# Patient Record
Sex: Male | Born: 1985 | Race: Black or African American | Hispanic: No | Marital: Single | Smoking: Never smoker
Health system: Southern US, Community
[De-identification: ages and names within clinical notes are randomized; demographics above are authoritative.]

---

## 2015-03-16 ENCOUNTER — Encounter (HOSPITAL_COMMUNITY): Payer: Self-pay | Admitting: Family Medicine

## 2015-03-16 ENCOUNTER — Emergency Department (HOSPITAL_COMMUNITY)
Admission: EM | Admit: 2015-03-16 | Discharge: 2015-03-16 | Disposition: A | Discharge status: Home / Self Care | Payer: Self-pay | Attending: Emergency Medicine | Admitting: Emergency Medicine

## 2015-03-16 DIAGNOSIS — R112 Nausea with vomiting, unspecified: Secondary | ICD-10-CM | POA: Insufficient documentation

## 2015-03-16 DIAGNOSIS — Z720 Tobacco use: Secondary | ICD-10-CM | POA: Insufficient documentation

## 2015-03-16 DIAGNOSIS — R61 Generalized hyperhidrosis: Secondary | ICD-10-CM | POA: Insufficient documentation

## 2015-03-16 DIAGNOSIS — R197 Diarrhea, unspecified: Secondary | ICD-10-CM | POA: Insufficient documentation

## 2015-03-16 DIAGNOSIS — R1110 Vomiting, unspecified: Secondary | ICD-10-CM

## 2015-03-16 MED ORDER — ONDANSETRON 4 MG PO TBDP: 4.0000 mg | ORAL_TABLET | Freq: Three times a day (TID) | ORAL | Status: DC | PRN

## 2015-03-16 MED ORDER — ONDANSETRON 4 MG PO TBDP
4.0000 mg | ORAL_TABLET | Freq: Once | ORAL | Status: AC
  Administered 2015-03-16: 4 mg via ORAL
  Filled 2015-03-16: qty 1

## 2015-03-16 NOTE — Discharge Instructions (Signed)

## 2015-03-16 NOTE — ED Provider Notes (Signed)
CSN: [id number]     Arrival date & time 03/16/15  0747 History   First MD Initiated Contact with Patient 03/16/15 [phone]     Chief Complaint  Patient presents with  . Diarrhea  . Emesis     (Consider location/radiation/quality/duration/timing/severity/associated sxs/prior Treatment) Patient is a 29 y.o. male presenting with general illness. The history is provided by the patient.  Illness Severity:  Mild Onset quality:  Sudden Duration:  1 day Timing:  Constant Progression:  Partially resolved Chronicity:  New Associated symptoms: nausea and vomiting   Associated symptoms: no abdominal pain, no chest pain, no congestion, no diarrhea, no fever, no headaches, no myalgias, no rash and no shortness of breath    29 year old male in no significant past medical history chief complaint of vomiting and diarrhea. Patient states that this started yesterday after eating some shrimp Alfredo. Patient tried to induce vomiting secondary to nausea and had multiple bouts of emesis followed by a couple loose stools. Patient denies abdominal pain denies fevers chills. Patient feeling much better today able to eat and drink this morning.  History reviewed. No pertinent past medical history. History reviewed. No pertinent past surgical history. History reviewed. No pertinent family history. History  Substance Use Topics  . Smoking status: Current Every Day Smoker  . Smokeless tobacco: Not on file  . Alcohol Use: Yes    Review of Systems  Constitutional: Positive for diaphoresis (prior to vomiting). Negative for fever and chills.  HENT: Negative for congestion and facial swelling.   Eyes: Negative for discharge and visual disturbance.  Respiratory: Negative for shortness of breath.   Cardiovascular: Negative for chest pain and palpitations.  Gastrointestinal: Positive for nausea and vomiting. Negative for abdominal pain and diarrhea.  Musculoskeletal: Negative for myalgias and arthralgias.  Skin:  Negative for color change and rash.  Neurological: Negative for tremors, syncope and headaches.  Psychiatric/Behavioral: Negative for confusion and dysphoric mood.      Allergies  Review of patient's allergies indicates no known allergies.  Home Medications   Prior to Admission medications   Not on File   BP 148/98 mmHg  Pulse 83  Temp(Src) 98.1 F (36.7 C)  Resp 18  Ht 5\' 9"  (1.753 m)  Wt 238 lb (107.956 kg)  BMI 35.13 kg/m2  SpO2 98% Physical Exam  Constitutional: He is oriented to person, place, and time. He appears well-developed and well-nourished.  HENT:  Head: Normocephalic and atraumatic.  Eyes: EOM are normal. Pupils are equal, round, and reactive to light.  Neck: Normal range of motion. Neck supple. No JVD present.  Cardiovascular: Normal rate and regular rhythm.  Exam reveals no gallop and no friction rub.   No murmur heard. Pulmonary/Chest: No respiratory distress. He has no wheezes.  Abdominal: He exhibits no distension. There is no tenderness. There is no rebound and no guarding.  Benign abdominal exam negative Murphy sign  Musculoskeletal: Normal range of motion.  Neurological: He is alert and oriented to person, place, and time.  Skin: No rash noted. No pallor.  Psychiatric: He has a normal mood and affect. His behavior is normal.    ED Course  Procedures (including critical care time) Labs Review Labs Reviewed - No data to display  Imaging Review No results found.   EKG Interpretation None      MDM   Final diagnoses:  None    29 yo male with a chief complaint of nausea vomiting diarrhea. Likely viral in nature. No noted abdominal pain. Afebrile.  No prior medical issues vital signs stable see no need for laboratory evaluation at this time. Will have follow-up with his PCP. Return precautions given. Patient mildly hypertensive on the ED. Will follow up with PCP in 1 week for recheck.  9:19 AM:  I have discussed the diagnosis/risks/treatment  options with the patient and believe the pt to be eligible for discharge home to follow-up with PCP. We also discussed returning to the ED immediately if new or worsening sx occur. We discussed the sx which are most concerning (e.g., abdominal pain, fever) that necessitate immediate return. Medications administered to the patient during their visit and any new prescriptions provided to the patient are listed below.    Medications given during this visit Medications  ondansetron (ZOFRAN-ODT) disintegrating tablet 4 mg (not administered)    New Prescriptions   ONDANSETRON (ZOFRAN ODT) 4 MG DISINTEGRATING TABLET    Take 1 tablet (4 mg total) by mouth every 8 (eight) hours as needed for nausea or vomiting.       Melene Plan, DO 03/16/15 [phone]

## 2015-03-16 NOTE — ED Notes (Signed)
Pt here for V,D that started last night about 9 pm after eating some shrimp alfredo. sts vomited a few x and diarrhea. sts this am feels better and held down some crackers and water.

## 2015-03-16 NOTE — ED Notes (Signed)
Pt placed in gown and on monitor. Call bell at bedside

## 2016-10-26 ENCOUNTER — Ambulatory Visit (HOSPITAL_COMMUNITY)
Admission: EM | Admit: 2016-10-26 | Discharge: 2016-10-26 | Disposition: A | Discharge status: Home / Self Care | Payer: Self-pay | Attending: Emergency Medicine | Admitting: Emergency Medicine

## 2016-10-26 ENCOUNTER — Encounter (HOSPITAL_COMMUNITY): Payer: Self-pay | Admitting: Emergency Medicine

## 2016-10-26 DIAGNOSIS — K047 Periapical abscess without sinus: Secondary | ICD-10-CM

## 2016-10-26 MED ORDER — HYDROCODONE-ACETAMINOPHEN 5-325 MG PO TABS: 1.0000 | ORAL_TABLET | ORAL | 0 refills | Status: DC | PRN

## 2016-10-26 MED ORDER — CLINDAMYCIN HCL 150 MG PO CAPS: 150.0000 mg | ORAL_CAPSULE | Freq: Four times a day (QID) | ORAL | 0 refills | Status: DC

## 2016-10-26 NOTE — ED Provider Notes (Signed)
CSN: [id number]     Arrival date & time 10/26/16  1926 History   First MD Initiated Contact with Patient 10/26/16 2038     Chief Complaint  Patient presents with  . Dental Pain   (Consider location/radiation/quality/duration/timing/severity/associated sxs/prior Treatment) 31 year old male presents with a 2-3 day history of dental pain. He does not have a dentist, and he has not had dental care in some time. He denies any fever, or nausea, however he describes his pain as intense, and throbbing. It is worse with chewing, and with foods of different temperature such as hot or cold.   The history is provided by the patient.  Dental Pain    History reviewed. No pertinent past medical history. History reviewed. No pertinent surgical history. History reviewed. No pertinent family history. Social History  Substance Use Topics  . Smoking status: Current Every Day Smoker  . Smokeless tobacco: Never Used  . Alcohol use Yes    Review of Systems  Reason unable to perform ROS: As covered in history of present illness.  All other systems reviewed and are negative.   Allergies  Patient has no known allergies.  Home Medications   Prior to Admission medications   Medication Sig Start Date End Date Taking? Authorizing Provider  clindamycin (CLEOCIN) 150 MG capsule Take 1 capsule (150 mg total) by mouth every 6 (six) hours. 10/26/16   Dorena BodoLawrence Rafeal Skibicki, NP  HYDROcodone-acetaminophen (NORCO/VICODIN) 5-325 MG tablet Take 1-2 tablets by mouth every 4 (four) hours as needed for moderate pain. 10/26/16   Dorena BodoLawrence Cosette Prindle, NP  ondansetron (ZOFRAN ODT) 4 MG disintegrating tablet Take 1 tablet (4 mg total) by mouth every 8 (eight) hours as needed for nausea or vomiting. 03/16/15   Melene Planan Floyd, DO   Meds Ordered and Administered this Visit  Medications - No data to display  BP 160/98 (BP Location: Right Arm)   Pulse 82   Temp 98.9 F (37.2 C) (Oral)   Resp 14   SpO2 99%  No data found.   Physical  Exam  Constitutional: He is oriented to person, place, and time. He appears well-developed and well-nourished. No distress.  HENT:  Head: Normocephalic and atraumatic.  Right Ear: External ear normal.  Left Ear: External ear normal.  Mouth/Throat: Dental abscesses and dental caries present.  Infected dental caries and abscess at the 15th tooth.  Neck: Normal range of motion.  Cardiovascular: Normal rate and regular rhythm.   Pulmonary/Chest: Effort normal and breath sounds normal.  Lymphadenopathy:    He has no cervical adenopathy.  Neurological: He is alert and oriented to person, place, and time.  Skin: Skin is warm and dry. Capillary refill takes less than 2 seconds. He is not diaphoretic.  Psychiatric: He has a normal mood and affect. His behavior is normal.  Nursing note and vitals reviewed.   Urgent Care Course     Procedures (including critical care time)  Labs Review Labs Reviewed - No data to display  Imaging Review No results found.     MDM   1. Dental infection     You have infected dental abscess. I prescribed 2 medicines for it, the first is an antibiotic called clindamycin. Take this medication as directed. I have also prescribed a medicine for pain called hydrocodone, this medicine is a narcotic, it will cause drowsiness, and it is addictive. Do not take more than what is necessary, do not drink alcohol while taking, and do not operate any heavy machinery while taking this medicine.  I also recommend taking over-the-counter ibuprofen, every 6 hours around-the-clock. This will help reduce your pain. You will need to follow-up with a dentist for definitive care, otherwise your pain will return.  [place] controlled substances reporting system consulted prior to issuing prescription.     Dorena Bodo, NP 10/26/16 2156

## 2016-10-26 NOTE — ED Triage Notes (Signed)
Here for left upper dental pain onset last night associated w/facial swelling  Denies fevers  A&O x4... NAD

## 2016-10-26 NOTE — Discharge Instructions (Addendum)
You have infected dental abscess. I prescribed 2 medicines for it, the first is an antibiotic called clindamycin. Take this medication as directed. I have also prescribed a medicine for pain called hydrocodone, this medicine is a narcotic, it will cause drowsiness, and it is addictive. Do not take more than what is necessary, do not drink alcohol while taking, and do not operate any heavy machinery while taking this medicine. I also recommend taking over-the-counter ibuprofen, every 6 hours around-the-clock. This will help reduce your pain. You will need to follow-up with a dentist for definitive care, otherwise your pain will return.

## 2017-12-02 ENCOUNTER — Other Ambulatory Visit: Payer: Self-pay

## 2017-12-02 ENCOUNTER — Encounter (HOSPITAL_COMMUNITY): Payer: Self-pay | Admitting: Emergency Medicine

## 2017-12-02 ENCOUNTER — Ambulatory Visit (HOSPITAL_COMMUNITY)
Admission: EM | Admit: 2017-12-02 | Discharge: 2017-12-02 | Disposition: A | Discharge status: Home / Self Care | Payer: Self-pay | Attending: Internal Medicine | Admitting: Internal Medicine

## 2017-12-02 DIAGNOSIS — L03317 Cellulitis of buttock: Secondary | ICD-10-CM

## 2017-12-02 DIAGNOSIS — R030 Elevated blood-pressure reading, without diagnosis of hypertension: Secondary | ICD-10-CM

## 2017-12-02 DIAGNOSIS — L0231 Cutaneous abscess of buttock: Secondary | ICD-10-CM

## 2017-12-02 MED ORDER — TRAMADOL HCL 50 MG PO TABS: 50.0000 mg | ORAL_TABLET | Freq: Four times a day (QID) | ORAL | 0 refills | Status: DC | PRN

## 2017-12-02 MED ORDER — SULFAMETHOXAZOLE-TRIMETHOPRIM 800-160 MG PO TABS: 1.0000 | ORAL_TABLET | Freq: Two times a day (BID) | ORAL | 0 refills | Status: DC

## 2017-12-02 NOTE — ED Triage Notes (Signed)
Patient complains of abscess.  History of the same.  This abscess present for 6 days.

## 2017-12-02 NOTE — ED Provider Notes (Signed)
[facility]    CSN: [id number] Arrival date & time: 12/02/17  1005     History   Chief Complaint Chief Complaint  Patient presents with  . Abscess    HPI Robert Marquez is a 32 y.o. male.   32 year old male presents with abscess to left buttock area. Started with a "bump" on the inside of his left buttock about 6 days ago- continues to get larger and more painful. Has been trying to apply warm compresses to area with minimal relief. No drainage. Has also taken OTC pain medication with minimal relief in pain- having difficulty sleeping due to location of abscess and pain level. Has history of previous abscess many months ago on right upper buttock but drained and resolved on own without treatment. No known injury to area. No fever, vomiting, diarrhea or constipation. No other chronic health issues- patient indicates that blood pressure and pulse is elevated today due to pain. Takes no daily medication.   The history is provided by the patient.    History reviewed. No pertinent past medical history.  There are no active problems to display for this patient.   History reviewed. No pertinent surgical history.     Home Medications    Prior to Admission medications   Medication Sig Start Date End Date Taking? Authorizing Provider  sulfamethoxazole-trimethoprim (BACTRIM DS,SEPTRA DS) 800-160 MG tablet Take 1 tablet by mouth 2 (two) times daily for 10 days. 12/02/17 12/12/17  Sudie Grumbling, NP  traMADol (ULTRAM) 50 MG tablet Take 1 tablet (50 mg total) by mouth every 6 (six) hours as needed for severe pain. 12/02/17   Sudie Grumbling, NP    Family History Family History  Problem Relation Age of Onset  . Healthy Mother     Social History Social History   Tobacco Use  . Smoking status: Current Every Day Smoker  . Smokeless tobacco: Never Used  Substance Use Topics  . Alcohol use: Yes  . Drug use: No     Allergies   Patient has no known  allergies.   Review of Systems Review of Systems  Constitutional: Negative for activity change, appetite change, chills, fatigue and fever.  Respiratory: Negative for cough, chest tightness, shortness of breath and wheezing.   Gastrointestinal: Positive for rectal pain. Negative for abdominal pain, anal bleeding, blood in stool, constipation, diarrhea, nausea and vomiting.  Genitourinary: Negative for difficulty urinating, dysuria and hematuria.  Musculoskeletal: Negative for arthralgias, back pain and myalgias.  Skin: Positive for color change and wound (abscess but not open). Negative for rash.  Allergic/Immunologic: Negative for immunocompromised state.  Neurological: Negative for dizziness, seizures, syncope, weakness, light-headedness, numbness and headaches.  Hematological: Negative for adenopathy. Does not bruise/bleed easily.  Psychiatric/Behavioral: Negative.      Physical Exam Triage Vital Signs ED Triage Vitals  Enc Vitals Group     BP 12/02/17 1027 (!) 138/100     Pulse Rate 12/02/17 1027 (!) 119     Resp 12/02/17 1027 (!) 22     Temp 12/02/17 1027 98.2 F (36.8 C)     Temp Source 12/02/17 1027 Oral     SpO2 12/02/17 1027 96 %     Weight --      Height --      Head Circumference --      Peak Flow --      Pain Score 12/02/17 1025 9     Pain Loc --      Pain Edu? --  Excl. in GC? --    No data found.  Updated Vital Signs BP (!) 138/100 (BP Location: Left Arm) Comment (BP Location): large cuff Simultaneous filing. User may not have seen previous data.  Pulse (!) 119 Comment: Simultaneous filing. User may not have seen previous data.  Temp 98.2 F (36.8 C) (Oral) Comment: Simultaneous filing. User may not have seen previous data. Comment (Src): Simultaneous filing. User may not have seen previous data.  Resp (!) 22 Comment: Simultaneous filing. User may not have seen previous data.  SpO2 96% Comment: Simultaneous filing. User may not have seen previous  data.  Visual Acuity Right Eye Distance:   Left Eye Distance:   Bilateral Distance:    Right Eye Near:   Left Eye Near:    Bilateral Near:     Physical Exam  Constitutional: He is oriented to person, place, and time. He appears well-developed and well-nourished. He is cooperative. He does not appear ill. No distress.  Patient nervous and appears in pain but in no acute distress.   HENT:  Head: Normocephalic and atraumatic.  Right Ear: External ear normal.  Left Ear: External ear normal.  Eyes: Conjunctivae and EOM are normal.  Neck: Normal range of motion.  Cardiovascular: Regular rhythm. Tachycardia present.  Pulmonary/Chest: Effort normal and breath sounds normal. No respiratory distress.  Musculoskeletal: Normal range of motion.  Neurological: He is alert and oriented to person, place, and time. He has normal strength. No sensory deficit.  Skin: Skin is warm, dry and intact. Capillary refill takes less than 2 seconds. Lesion noted. No bruising, no ecchymosis and no rash noted. There is erythema.     About 4cm by 2cm firm dark red abscess present on left inner buttock near pilonidal tract but not part of the tract. Warm and Very tender. No drainage or central punctated area.  Surrounding erythema of about 1cm beyond edge of abscess.   Psychiatric: His speech is normal and behavior is normal. Judgment and thought content normal. His mood appears anxious. Cognition and memory are normal.  Vitals reviewed.    UC Treatments / Results  Labs (all labs ordered are listed, but only abnormal results are displayed) Labs Reviewed - No data to display  EKG None Radiology No results found.  Procedures Incision and Drainage Date/Time: 12/02/2017 11:30 AM Performed by: Sudie Grumbling, NP Authorized by: Isa Rankin, MD   Consent:    Consent obtained:  Verbal   Consent given by:  Patient   Risks discussed:  Bleeding, incomplete drainage and pain   Alternatives  discussed:  Alternative treatment, referral and no treatment Location:    Type:  Abscess   Size:  4cm by 2cm   Location:  Anogenital   Anogenital location:  Gluteal cleft Pre-procedure details:    Skin preparation:  Betadine Anesthesia (see MAR for exact dosages):    Anesthesia method:  Topical application   Topical anesthesia: anesthetic spray. Procedure type:    Complexity:  Simple Procedure details:    Needle aspiration: no     Incision types:  Single straight   Incision depth:  Dermal   Scalpel blade:  11   Wound management:  Debrided   Drainage:  Bloody and serosanguinous   Drainage amount:  Moderate   Wound treatment:  Wound left open   Packing materials:  None Post-procedure details:    Patient tolerance of procedure:  Tolerated well, no immediate complications Comments:     Patient tolerated procedure well. Mostly  thin yellowish to bloody discharge expressed. Unable to drain completely. Covered with non-stick gauze.    (including critical care time)  Medications Ordered in UC Medications - No data to display   Initial Impression / Assessment and Plan / UC Course  I have reviewed the triage vital signs and the nursing notes.  Pertinent labs & imaging results that were available during my care of the patient were reviewed by me and considered in my medical decision making (see chart for details).    Discussed possibility of incomplete drainage due to depth of abscess. Patient understood and requested to proceed with I & D today. No distinct pus pocket detected. Slow drainage of fluid present but abscess did decrease considerably in size. Recommend start Bactrim DS 1 tablet twice a day as directed for 10 days. May take OTC Ibuprofen 600mg  every 6 hours as needed for pain. May also take Tramadol 50mg  every 6 hours as needed for severe pain. Continue to soak in warm water 3 to 4 times a day as needed to help with drainage. Recommend call Surgeon (information provided) for  further evaluation if abscess does not improve or start resolving within 4 to 5 days.    Final Clinical Impressions(s) / UC Diagnoses   Final diagnoses:  Abscess of buttock, left  Cellulitis of buttock  Elevated blood-pressure reading without diagnosis of hypertension    ED Discharge Orders        Ordered    sulfamethoxazole-trimethoprim (BACTRIM DS,SEPTRA DS) 800-160 MG tablet  2 times daily     12/02/17 1124    traMADol (ULTRAM) 50 MG tablet  Every 6 hours PRN     12/02/17 1126       Controlled Substance Prescriptions [place] Controlled Substance Registry consulted? Yes, I have consulted the [place] Controlled Substances Registry for this patient, and feel the risk/benefit ratio today is favorable for proceeding with this prescription for a controlled substance. Last active Rx was March 2018 for Hydrocodone-Acetaminophen.  No active Rx in the past 6 months.    Sudie GrumblingAmyot, Ann Berry, NP 12/02/17 2013

## 2017-12-02 NOTE — Discharge Instructions (Addendum)
Recommend start Bactrim DS 1 tablet twice a day as directed for 10 days. May take Ibuprofen 600mg  every 6 hours as needed for pain. May also take Tramadol 50mg  every 6 hours as needed for severe pain. Continue to soak in warm water 3 to 4 times a day as needed to help with drainage. Recommend call Surgeon for further evaluation if abscess does not improve or start resolving within 4 to 5 days.

## 2017-12-05 ENCOUNTER — Inpatient Hospital Stay (HOSPITAL_COMMUNITY): Payer: Self-pay

## 2017-12-05 ENCOUNTER — Inpatient Hospital Stay (HOSPITAL_COMMUNITY)
Admission: EM | Admit: 2017-12-05 | Discharge: 2017-12-07 | DRG: 854 | Disposition: A | Discharge status: Home / Self Care | Payer: Self-pay | Attending: Family Medicine | Admitting: Family Medicine

## 2017-12-05 ENCOUNTER — Encounter (HOSPITAL_COMMUNITY): Payer: Self-pay | Admitting: Emergency Medicine

## 2017-12-05 DIAGNOSIS — Z79899 Other long term (current) drug therapy: Secondary | ICD-10-CM

## 2017-12-05 DIAGNOSIS — A419 Sepsis, unspecified organism: Principal | ICD-10-CM | POA: Diagnosis present

## 2017-12-05 DIAGNOSIS — L03317 Cellulitis of buttock: Secondary | ICD-10-CM | POA: Diagnosis present

## 2017-12-05 DIAGNOSIS — L0231 Cutaneous abscess of buttock: Secondary | ICD-10-CM

## 2017-12-05 LAB — BASIC METABOLIC PANEL
ANION GAP: 13 (ref 5–15)
BUN: 9 mg/dL (ref 6–20)
CO2: 26 mmol/L (ref 22–32)
CREATININE: 0.96 mg/dL (ref 0.61–1.24)
Calcium: 10.3 mg/dL (ref 8.9–10.3)
Chloride: 98 mmol/L — ABNORMAL LOW (ref 101–111)
GFR calc Af Amer: >60 mL/min (ref >60)
Glucose, Bld: 121 mg/dL — ABNORMAL HIGH (ref 65–99)
Potassium: 3.6 mmol/L (ref 3.5–5.1)
SODIUM: 137 mmol/L (ref 135–145)

## 2017-12-05 LAB — CBC WITH DIFFERENTIAL/PLATELET
Basophils Absolute: 0 10*3/uL (ref 0.0–0.1)
Basophils Relative: 0 %
EOS ABS: 0.2 10*3/uL (ref 0.0–0.7)
Eosinophils Relative: 1 %
HCT: 41.2 % (ref 39.0–52.0)
Hemoglobin: 14.3 g/dL (ref 13.0–17.0)
LYMPHS PCT: 16 %
Lymphs Abs: 3.4 10*3/uL (ref 0.7–4.0)
MCH: 29.4 pg (ref 26.0–34.0)
MCHC: 34.7 g/dL (ref 30.0–36.0)
MCV: 84.6 fL (ref 78.0–100.0)
Monocytes Absolute: 2.8 10*3/uL — ABNORMAL HIGH (ref 0.1–1.0)
Monocytes Relative: 13 %
Neutro Abs: 15 10*3/uL — ABNORMAL HIGH (ref 1.7–7.7)
Neutrophils Relative %: 70 %
PLATELETS: 452 10*3/uL — AB (ref 150–400)
RBC: 4.87 MIL/uL (ref 4.22–5.81)
RDW: 13 % (ref 11.5–15.5)
WBC: 21.4 10*3/uL — ABNORMAL HIGH (ref 4.0–10.5)

## 2017-12-05 LAB — I-STAT CG4 LACTIC ACID, ED: Lactic Acid, Venous: 1.82 mmol/L (ref 0.5–1.9)

## 2017-12-05 MED ORDER — PIPERACILLIN-TAZOBACTAM 3.375 G IVPB
3.3750 g | Freq: Three times a day (TID) | INTRAVENOUS | Status: DC
  Administered 2017-12-06 – 2017-12-07 (×4): 3.375 g via INTRAVENOUS
  Filled 2017-12-05 (×6): qty 50

## 2017-12-05 MED ORDER — LIDOCAINE HCL URETHRAL/MUCOSAL 2 % EX GEL
1.0000 "application " | Freq: Once | CUTANEOUS | Status: AC
  Administered 2017-12-05: 1 via TOPICAL
  Filled 2017-12-05: qty 5

## 2017-12-05 MED ORDER — LIDOCAINE HCL 2 % IJ SOLN
10.0000 mL | Freq: Once | INTRAMUSCULAR | Status: AC
  Administered 2017-12-05: 200 mg
  Filled 2017-12-05: qty 20

## 2017-12-05 MED ORDER — OXYCODONE-ACETAMINOPHEN 5-325 MG PO TABS
1.0000 | ORAL_TABLET | Freq: Once | ORAL | Status: AC
  Administered 2017-12-05: 1 via ORAL
  Filled 2017-12-05: qty 1

## 2017-12-05 MED ORDER — PIPERACILLIN-TAZOBACTAM 3.375 G IVPB 30 MIN
3.3750 g | Freq: Once | INTRAVENOUS | Status: AC
  Administered 2017-12-05: 3.375 g via INTRAVENOUS
  Filled 2017-12-05: qty 50

## 2017-12-05 MED ORDER — SODIUM CHLORIDE 0.9 % IJ SOLN
INTRAMUSCULAR | Status: AC
  Administered 2017-12-06: 3 mL
  Filled 2017-12-05: qty 50

## 2017-12-05 MED ORDER — VANCOMYCIN HCL IN DEXTROSE 1-5 GM/200ML-% IV SOLN
1000.0000 mg | Freq: Once | INTRAVENOUS | Status: AC
  Administered 2017-12-05: 1000 mg via INTRAVENOUS
  Filled 2017-12-05: qty 200

## 2017-12-05 MED ORDER — IOPAMIDOL (ISOVUE-300) INJECTION 61%
INTRAVENOUS | Status: AC
  Filled 2017-12-05: qty 100

## 2017-12-05 MED ORDER — IOPAMIDOL (ISOVUE-300) INJECTION 61%
100.0000 mL | Freq: Once | INTRAVENOUS | Status: AC | PRN
  Administered 2017-12-05: 100 mL via INTRAVENOUS

## 2017-12-05 NOTE — ED Triage Notes (Signed)
Patient c/o abscess to left of buttocks. [facility] and [facility] to [facility] abscess, only got blood out.

## 2017-12-05 NOTE — Progress Notes (Signed)
Pharmacy Antibiotic Note  Robert Marquez is a 32 y.o. male admitted on 12/05/2017 with buttocks abscess.  Pharmacy has been consulted for vancomycin and zosyn dosing. Per RN note, recently had attempted drainage of abscess (bloody drainage).    Today, 12/05/2017  WBC elevated  Febrile  SCr WNL  Plan:  Vancomycin 1gm x 1 pending weight  Zosyn 3.375gm IV q8h over 4h infusion  Daily SCr for increased risk of nephrotoxicity with above regimen  Check levels if remains on vancomycin  F/u ability to narrow antibiotics   Temp (24hrs), Avg:99.5 F (37.5 C), Min:98.3 F (36.8 C), Max:100.8 F (38.2 C)  Recent Labs  Lab 12/05/17 1855 12/05/17 2035  WBC 21.4*  --   CREATININE 0.96  --   LATICACIDVEN  --  1.82    CrCl cannot be calculated (Unknown ideal weight.).    No Known Allergies  Antimicrobials this admission: 4/18 vanco >> 4/18 zosyn >>  Dose adjustments this admission:  Microbiology results: 4/18 buttocks abscess: 4/18 Bcx:   Thank you for allowing pharmacy to be a part of this patient's care.  Juliette Alcideustin Zeigler, PharmD, BCPS.   Pager: [phone] 12/05/2017 9:49 PM

## 2017-12-05 NOTE — H&P (Signed)
History and Physical    Robert Marquez:[id number] DOB: [birth year] DOA: 12/05/2017  PCP: Patient, No Pcp Per   Patient coming from: Home  Chief Complaint:  Left buttock abscess  HPI: Robert LatinoBrandon Folse is a 32 y.o. male with no medical history , presented to the ED with complaints of swelling pain on his left buttock of about 8 days duration now.  Patient frequently presented to the urgent care 12/02/17- where he was found to have an abscess, which was lanced, only blood was drained.  It was sent home on tramadol and Bactrim which she reports compliance with for the past 4 days.  But with worsening pain and swelling [facility]  ED Course: Temperature 100.8, tachycardia to 119.  WBC- 21, lactic acid 1.82.  Patient had incision and drainage in the ED, large amount of purulent fluid was drained as described by EDP, and continued to drain.  Hospitalist was called to admit for abscess.   Review of Systems: As per HPI otherwise 10 point review of systems negative.  History reviewed. No pertinent past medical history.  History reviewed. No pertinent surgical history.   reports that he has been smoking.  He has never used smokeless tobacco. He reports that he drinks alcohol. He reports that he does not use drugs.  No Known Allergies  Family History  Problem Relation Age of Onset  . Healthy Mother    Family history noncontributory.  Prior to Admission medications   Medication Sig Start Date End Date Taking? Authorizing Provider  ibuprofen (ADVIL,MOTRIN) 200 MG tablet Take 800 mg by mouth every 6 (six) hours as needed for mild pain.   Yes [provider]  sulfamethoxazole-trimethoprim (BACTRIM DS,SEPTRA DS) 800-160 MG tablet Take 1 tablet by mouth 2 (two) times daily for 10 days. 12/02/17 12/12/17 Yes Amyot, Ali LoweAnn Berry, NP  traMADol (ULTRAM) 50 MG tablet Take 1 tablet (50 mg total) by mouth every 6 (six) hours as needed for severe pain. Patient not taking: Reported on  12/05/2017 12/02/17   Sudie GrumblingAmyot, Ann Berry, NP    Physical Exam: Vitals:   12/05/17 1652 12/05/17 1904 12/05/17 2023  BP: (!) 156/97 137/86   Pulse: (!) 119 (!) 112   Resp: 20 18   Temp: 98.3 F (36.8 C) 99.5 F (37.5 C) (!) 100.8 F (38.2 C)  TempSrc: Oral Oral Oral  SpO2: 100% 96%     Constitutional: NAD, calm, comfortable Vitals:   12/05/17 1652 12/05/17 1904 12/05/17 2023  BP: (!) 156/97 137/86   Pulse: (!) 119 (!) 112   Resp: 20 18   Temp: 98.3 F (36.8 C) 99.5 F (37.5 C) (!) 100.8 F (38.2 C)  TempSrc: Oral Oral Oral  SpO2: 100% 96%    Eyes: PERRL, lids and conjunctivae normal ENMT: Mucous membranes are moist. Posterior pharynx clear of any exudate or lesions.Normal dentition.  Neck: normal, supple, no masses, no thyromegaly Respiratory: clear to auscultation bilaterally, no wheezing, no crackles. Normal respiratory effort. No accessory muscle use.  Cardiovascular: Regular rate and rhythm, no murmurs / rubs / gallops. No extremity edema. 2+ pedal pulses. No carotid bruits.  Abdomen: no tenderness, no masses palpated. No hepatosplenomegaly. Bowel sounds positive.  Musculoskeletal: no clubbing / cyanosis. No joint deformity upper and lower extremities. Good ROM, no contractures. Normal muscle tone.  Skin: laceration on left buttock for Butt Abcess, right buttock also tender with induration Neurologic: CN 2-12 grossly intact. Sensation intact, DTR normal. Strength 5/5 in all 4.  Psychiatric:  Normal judgment and insight. Alert and oriented x 3. Normal mood.   Labs on Admission: I have personally reviewed following labs and imaging studies  CBC: Recent Labs  Lab 12/05/17 1855  WBC 21.4*  NEUTROABS 15.0*  HGB 14.3  HCT 41.2  MCV 84.6  PLT 452*   Basic Metabolic Panel: Recent Labs  Lab 12/05/17 1855  NA 137  K 3.6  CL 98*  CO2 26  GLUCOSE 121*  BUN 9  CREATININE 0.96  CALCIUM 10.3    Radiological Exams on Admission: No results found.  EKG: None    Assessment/Plan Active Problems:   Left buttock abscess   Left Buttock Abscess with sepsis-large amount of purulent drainage when I and d was done by EDP. Fever, leukocytosis- 21, tachycardia. Lactic acid- 1.8 -Follow-up anaerobic cultures from I&D done in ED -Will get blood cultures -Patient failed outpatient Bactrim, will start broad coverage with vancomycin and Zosyn, also to cover GI pathogens  -Will get CT pelvis with contrast - Scds for now if additional if additional procedures needed. - N/s + 20KCl 100cc/hr X 12 hrs - CBC a.m - Percocet  DVT prophylaxis: Scds for now Code Status: Full Family Communication: None Disposition Plan: > 2 days Consults called: None  Admission status: Inpt, tele   Onnie Boer MD [facility] Pager [phone] From 6PM-2AM.  Otherwise please contact night-coverage [email or web address] Password [facility]   12/05/2017, 10:12 PM

## 2017-12-05 NOTE — ED Provider Notes (Signed)
[facility]   CSN: [id number] Arrival date & time: 12/05/17  1606     History   Chief Complaint Chief Complaint  Patient presents with  . Abscess    HPI Robert Marquez is a 32 y.o. male who presents to the ED with an abscess. The abscess is located on the left buttocks. Patient reports he was at Urgent care recently and they attempted to drain the area but only blood came out. Patient was started on Bactrim and Tramadol. Pain today is severe and patient reports fever.   The history is provided by the patient. No language interpreter was used.  Abscess  Abscess location: left buttock. Abscess quality: fluctuance, painful, redness and warmth   Red streaking: no   Duration:  1 week Progression:  Worsening Pain details:    Quality:  Hot and burning   Severity:  Severe   Timing:  Constant   Progression:  Worsening Chronicity:  New Relieved by:  Nothing Worsened by:  Nothing Ineffective treatments:  NSAIDs, oral antibiotics and warm compresses Associated symptoms: fever   Associated symptoms: no headaches, no nausea and no vomiting   Risk factors: prior abscess     History reviewed. No pertinent past medical history.  Patient Active Problem List   Diagnosis Date Noted  . Left buttock abscess 12/05/2017    History reviewed. No pertinent surgical history.      Home Medications    Prior to Admission medications   Medication Sig Start Date End Date Taking? Authorizing Provider  ibuprofen (ADVIL,MOTRIN) 200 MG tablet Take 800 mg by mouth every 6 (six) hours as needed for mild pain.   Yes [provider]  sulfamethoxazole-trimethoprim (BACTRIM DS,SEPTRA DS) 800-160 MG tablet Take 1 tablet by mouth 2 (two) times daily for 10 days. 12/02/17 12/12/17 Yes Amyot, Ali LoweAnn Berry, NP  traMADol (ULTRAM) 50 MG tablet Take 1 tablet (50 mg total) by mouth every 6 (six) hours as needed for severe pain. Patient not taking: Reported  on 12/05/2017 12/02/17   Sudie GrumblingAmyot, Ann Berry, NP    Family History Family History  Problem Relation Age of Onset  . Healthy Mother     Social History Social History   Tobacco Use  . Smoking status: Current Every Day Smoker  . Smokeless tobacco: Never Used  Substance Use Topics  . Alcohol use: Yes  . Drug use: No     Allergies   Patient has no known allergies.   Review of Systems Review of Systems  Constitutional: Positive for fever.  HENT: Negative.   Respiratory: Negative for shortness of breath.   Cardiovascular: Negative for chest pain.  Gastrointestinal: Negative for abdominal pain, nausea and vomiting.  Musculoskeletal: Negative for back pain.  Skin: Positive for wound.  Neurological: Negative for headaches.  All other systems reviewed and are negative.    Physical Exam Updated Vital Signs BP 137/86 (BP Location: Right Arm)   Pulse (!) 112   Temp (!) 100.8 F (38.2 C) (Oral)   Resp 18   SpO2 96%   Physical Exam  Constitutional: He appears well-developed and well-nourished. No distress.  HENT:  Head: Normocephalic.  Eyes: EOM are normal.  Neck: Neck supple.  Cardiovascular: Tachycardia present.  Pulmonary/Chest: Effort normal.  Genitourinary: Rectum normal.  Musculoskeletal: Normal range of motion.  Neurological: He is alert.  Skin:  Approximately 8 cm raised red area to left buttock, tender on exam.  Psychiatric: He has a normal mood and affect.  Nursing note and vitals reviewed.    ED Treatments / Results  Labs (all labs ordered are listed, but only abnormal results are displayed) Labs Reviewed  CBC WITH DIFFERENTIAL/PLATELET - Abnormal; Notable for the following components:      Result Value   WBC 21.4 (*)    Platelets 452 (*)    Neutro Abs 15.0 (*)    Monocytes Absolute 2.8 (*)    All other components within normal limits  BASIC METABOLIC PANEL - Abnormal; Notable for the following components:   Chloride 98 (*)    Glucose, Bld 121 (*)     All other components within normal limits  CULTURE, BLOOD (ROUTINE X 2)  CULTURE, BLOOD (ROUTINE X 2)  AEROBIC/ANAEROBIC CULTURE (SURGICAL/DEEP WOUND)  I-STAT CG4 LACTIC ACID, ED    Radiology No results found.  Procedures .Marland KitchenIncision and Drainage Date/Time: 12/05/2017 8:10 PM Performed by: Janne Napoleon, NP Authorized by: Janne Napoleon, NP   Consent:    Consent obtained:  Verbal   Consent given by:  Patient   Risks discussed:  Incomplete drainage and pain   Alternatives discussed:  Alternative treatment Location:    Type:  Abscess   Location:  Anogenital   Anogenital location: left buttock. Pre-procedure details:    Skin preparation:  Betadine Anesthesia (see MAR for exact dosages):    Anesthesia method:  Topical application and local infiltration   Topical anesthetic:  Lidocaine gel   Local anesthetic:  Lidocaine 2% w/o epi Procedure type:    Complexity:  Complex Procedure details:    Needle aspiration: no     Incision types:  Single straight   Incision depth:  Dermal   Scalpel blade:  11   Wound management:  Probed and deloculated, irrigated with saline and extensive cleaning   Drainage:  Purulent   Drainage amount:  Copious   Packing materials:  1/4 in iodoform gauze Post-procedure details:    Patient tolerance of procedure:  Tolerated well, no immediate complications   (including critical care time)  Medications Ordered in ED Medications  lidocaine (XYLOCAINE) 2 % jelly 1 application (1 application Topical Given 12/05/17 1919)  lidocaine (XYLOCAINE) 2 % (with pres) injection 200 mg (200 mg Infiltration Given by Other 12/05/17 1930)  oxyCODONE-acetaminophen (PERCOCET/ROXICET) 5-325 MG per tablet 1 tablet (1 tablet Oral Given 12/05/17 1834)    After I&D recheck of patient temp and is 100.1, patient remains tachycardic. Discussed with Dr. Rubin Payor and I will consult Hospitalist about admission for IV antibiotics. Cultures from wound obtained and sent.   Initial  Impression / Assessment and Plan / ED Course  I have reviewed the triage vital signs and the nursing notes.   Final Clinical Impressions(s) / ED Diagnoses   Final diagnoses:  Abscess of buttock, left    ED Discharge Orders    None       Kerrie Buffalo English, [place] 12/05/17 2135    Benjiman Core, MD 12/05/17 2329

## 2017-12-05 NOTE — ED Notes (Addendum)
Patient transported to CT  This nurse stuck 2x for second set of blood cultures with no success, pt is hard stick

## 2017-12-06 ENCOUNTER — Other Ambulatory Visit: Payer: Self-pay

## 2017-12-06 LAB — CBC
HEMATOCRIT: 37.9 % — AB (ref 39.0–52.0)
HEMOGLOBIN: 12.9 g/dL — AB (ref 13.0–17.0)
MCH: 28.8 pg (ref 26.0–34.0)
MCHC: 34 g/dL (ref 30.0–36.0)
MCV: 84.6 fL (ref 78.0–100.0)
Platelets: 443 10*3/uL — ABNORMAL HIGH (ref 150–400)
RBC: 4.48 MIL/uL (ref 4.22–5.81)
RDW: 13.2 % (ref 11.5–15.5)
WBC: 23.8 10*3/uL — ABNORMAL HIGH (ref 4.0–10.5)

## 2017-12-06 LAB — CREATININE, SERUM: Creatinine, Ser: 0.81 mg/dL (ref 0.61–1.24)

## 2017-12-06 MED ORDER — VANCOMYCIN HCL 10 G IV SOLR
1250.0000 mg | Freq: Two times a day (BID) | INTRAVENOUS | Status: DC
  Administered 2017-12-06 – 2017-12-07 (×3): 1250 mg via INTRAVENOUS
  Filled 2017-12-06 (×4): qty 1250

## 2017-12-06 MED ORDER — POLYETHYLENE GLYCOL 3350 17 G PO PACK: 17.0000 g | PACK | Freq: Every day | ORAL | Status: DC | PRN

## 2017-12-06 MED ORDER — ONDANSETRON HCL 4 MG/2ML IJ SOLN: 4.0000 mg | Freq: Four times a day (QID) | INTRAMUSCULAR | Status: DC | PRN

## 2017-12-06 MED ORDER — HYDROCODONE-ACETAMINOPHEN 5-325 MG PO TABS
1.0000 | ORAL_TABLET | ORAL | Status: DC | PRN
  Administered 2017-12-06 (×2): 2 via ORAL
  Filled 2017-12-06 (×2): qty 2

## 2017-12-06 MED ORDER — ONDANSETRON HCL 4 MG PO TABS: 4.0000 mg | ORAL_TABLET | Freq: Four times a day (QID) | ORAL | Status: DC | PRN

## 2017-12-06 MED ORDER — POTASSIUM CHLORIDE IN NACL 20-0.9 MEQ/L-% IV SOLN
INTRAVENOUS | Status: AC
  Administered 2017-12-06: 02:00:00 via INTRAVENOUS
  Filled 2017-12-06 (×2): qty 1000

## 2017-12-06 NOTE — ED Notes (Signed)
ED TO INPATIENT HANDOFF REPORT  Name/Age/Gender Robert Marquez 32 y.o. male  Code Status   Home/SNF/Other Home  Chief Complaint abcess on buttock  Level of Care/Admitting Diagnosis ED Disposition    ED Disposition Condition [facility] Area: [place] [[id number]]  Level of Care: Telemetry [5]  Admit to tele based on following criteria: Complex arrhythmia (Bradycardia/Tachycardia)  Diagnosis: Left buttock abscess [697710]  Admitting Physician: Bethena Roys [[id number]]  Attending Physician: Bethena Roys [phone]  Estimated length of stay: past midnight tomorrow  Certification:: I certify this patient will need inpatient services for at least 2 midnights  PT Class (Do Not Modify): Inpatient [101]  PT Acc Code (Do Not Modify): Private [1]       Medical History History reviewed. No pertinent past medical history.  Allergies No Known Allergies  IV Location/Drains/Wounds Patient Lines/Drains/Airways Status   Active Line/Drains/Airways    Name:   Placement date:   Placement time:   Site:   Days:   Peripheral IV 12/05/17 Right Antecubital   12/05/17    2022    Antecubital   1          Labs/Imaging Results for orders placed or performed during the hospital encounter of 12/05/17 (from the past 48 hour(s))  CBC with Differential/Platelet     Status: Abnormal   Collection Time: 12/05/17  6:55 PM  Result Value Ref Range   WBC 21.4 (H) 4.0 - 10.5 K/uL   RBC 4.87 4.22 - 5.81 MIL/uL   Hemoglobin 14.3 13.0 - 17.0 g/dL   HCT 41.2 39.0 - 52.0 %   MCV 84.6 78.0 - 100.0 fL   MCH 29.4 26.0 - 34.0 pg   MCHC 34.7 30.0 - 36.0 g/dL   RDW 13.0 11.5 - 15.5 %   Platelets 452 (H) 150 - 400 K/uL   Neutrophils Relative % 70 %   Lymphocytes Relative 16 %   Monocytes Relative 13 %   Eosinophils Relative 1 %   Basophils Relative 0 %   Neutro Abs 15.0 (H) 1.7 - 7.7 K/uL   Lymphs Abs 3.4 0.7 - 4.0 K/uL   Monocytes Absolute 2.8 (H) 0.1 - 1.0  K/uL   Eosinophils Absolute 0.2 0.0 - 0.7 K/uL   Basophils Absolute 0.0 0.0 - 0.1 K/uL   WBC Morphology MILD LEFT SHIFT (1-5% METAS, OCC MYELO, OCC BANDS)     Comment: Performed at [facility], [place] [address], [place], [place] [address]  Basic metabolic panel     Status: Abnormal   Collection Time: 12/05/17  6:55 PM  Result Value Ref Range   Sodium 137 135 - 145 mmol/L   Potassium 3.6 3.5 - 5.1 mmol/L   Chloride 98 (L) 101 - 111 mmol/L   CO2 26 22 - 32 mmol/L   Glucose, Bld 121 (H) 65 - 99 mg/dL   BUN 9 6 - 20 mg/dL   Creatinine, Ser 0.96 0.61 - 1.24 mg/dL   Calcium 10.3 8.9 - 10.3 mg/dL   GFR calc non Af Amer >60 >60 mL/min   GFR calc Af Amer >60 >60 mL/min    Comment: (NOTE) The eGFR has been calculated using the CKD EPI equation. This calculation has not been validated in all clinical situations. eGFR's persistently <60 mL/min signify possible Chronic Kidney Disease.    Anion gap 13 5 - 15    Comment: Performed at [facility], [place] [address], [place],  [address]  I-Stat CG4 Lactic Acid, ED     Status: None   Collection Time: 12/05/17  8:35 PM  Result Value Ref Range   Lactic Acid, Venous 1.82 0.5 - 1.9 mmol/L   Ct Pelvis W Contrast  Result Date: 12/05/2017 CLINICAL DATA:  Left buttock abscess. EXAM: CT PELVIS WITH CONTRAST TECHNIQUE: Multidetector CT imaging of the pelvis was performed using the standard protocol following the bolus administration of intravenous contrast. CONTRAST:  163m ISOVUE-300 IOPAMIDOL (ISOVUE-300) INJECTION 61% COMPARISON:  None. FINDINGS: Urinary Tract: No abnormality visualized. Physiologically distended urinary bladder without focal mural thickening. Bowel: Unremarkable visualized pelvic bowel loops. Normal visualized appendix. Minimal colonic diverticulosis along the sigmoid colon without diverticulitis. Vascular/Lymphatic: No pathologically enlarged lymph nodes along the pelvic sidewall nor inguinal  regions. No significant vascular abnormality seen. Reproductive: Normal size prostate and seminal vesicles. Small central zone calcifications are noted of the prostate. Other:  No hernia identified. Musculoskeletal: Inflammatory change involving the medial and posterior subcutaneous soft tissues of the left buttock with overlying inflammatory skin thickening. No drainable fluid collection. IMPRESSION: Cellulitis of the left buttock along the medial and posterior aspect with dermal thickening. No drainable fluid collections or adenopathy. No fistulous connection identified to the adjacent anus or rectum. Electronically Signed   By: DAshley RoyaltyM.D.   On: 12/05/2017 22:59    Pending Labs Unresulted Labs (From admission, onward)   Start     Ordered   12/06/17 0500  Creatinine, serum  Daily,   R     12/05/17 2159   12/05/17 2104  Culture, blood (routine x 2)  BLOOD CULTURE X 2,   R     12/05/17 2103   12/05/17 2104  Aerobic/Anaerobic Culture (surgical/deep wound)  Once,   R     12/05/17 2103   Signed and Held  HIV antibody (Routine Testing)  Once,   R     Signed and Held   Signed and Held  CBC  Tomorrow morning,   R     Signed and Held      Vitals/Pain Today's Vitals   12/05/17 1904 12/05/17 2023 12/05/17 2242 12/05/17 2300  BP: 137/86  (!) 140/97 135/86  Pulse: (!) 112  88 (!) 113  Resp: _0 Temp: 99.5 F (37.5 C) (!) 100.8 F (38.2 C)    TempSrc: Oral Oral    SpO2: 96%  99% 100%  PainSc:        Isolation Precautions No active isolations  Medications Medications  piperacillin-tazobactam (ZOSYN) IVPB 3.375 g (has no administration in time range)  iopamidol (ISOVUE-300) 61 % injection (has no administration in time range)  sodium chloride 0.9 % injection (has no administration in time range)  lidocaine (XYLOCAINE) 2 % jelly 1 application (1 application Topical Given 12/05/17 1919)  lidocaine (XYLOCAINE) 2 % (with pres) injection 200 mg (200 mg Infiltration Given by Other  12/05/17 1930)  oxyCODONE-acetaminophen (PERCOCET/ROXICET) 5-325 MG per tablet 1 tablet (1 tablet Oral Given 12/05/17 1834)  vancomycin (VANCOCIN) IVPB 1000 mg/200 mL premix (0 mg Intravenous Stopped 12/06/17 0000)  piperacillin-tazobactam (ZOSYN) IVPB 3.375 g (0 g Intravenous Stopped 12/05/17 2245)  iopamidol (ISOVUE-300) 61 % injection 100 mL (100 mLs Intravenous Contrast Given 12/05/17 2220)    Mobility walks

## 2017-12-06 NOTE — Progress Notes (Signed)
Rx Brief note: Vancomycin  See 4/18 note by Hurshel Party. Zeigler for details.  Weight finally place in Epic.  Plan: Vancomycin 1250 mg IV q12h for est AUC=515 Goal AUC = 400-500  Thanks Lorenza EvangelistGreen, Oswald Pott R 12/06/2017 4:48 AM

## 2017-12-06 NOTE — Progress Notes (Signed)
PROGRESS NOTE [facility]   Robert Marquez   ZOX:[id number] DOB: 25-Oct-1985  DOA: 12/05/2017 PCP: Patient, No Pcp Per   Brief Narrative:  Robert Marquez is a 32 year old male with no significant past medical history who presented to the emergency department complaining of swelling and pain of his left buttock for about a week prior to admission.  Patient was seen at urgent care on 4/15 where he was found to have left buttock abscess which was lanced but only blood was drained.  He was sent home on pain medications and Bactrim, however swelling and pain worsened despite antibiotic therapy.  Upon ED evaluation patient was found to be febrile with tachycardia and WBC of 21.  Left buttock abscess was I&D in the ED and large amount of purulent fluid was drained.  Patient was admitted with working diagnosis of left buttock abscess/cellulitis after failing outpatient therapy.  Subjective: Patient seen and examined, feeling much better today. Able to have a BM this morning. Febrile overnight. WBC increased.   Assessment & Plan: Sepsis due to left buttock abscess/cellulitis Status post I&D 4/18 CT pelvis with cellulitis of left buttock along the medial and posterior aspect.  No drainable collection or fistulous. Patient started on vancomycin and Zosyn, agree and will continue for at least 24 hours. Follow-up blood and wound cultures Continue supportive management Pain control as needed CBC in a.m. If continues to clinically improve and remains afebrile for 24 hours will discharge in the morning with oral antibiotics.  DVT prophylaxis: SCDs Code Status: Full code Family Communication: None at bedside Disposition Plan: Home in a.m. if clinically improving  Consultants:   None  Procedures:   I&D 4/18  Antimicrobials: Anti-infectives (From admission, onward)   Start     Dose/Rate Route Frequency Ordered Stop   12/06/17 0600  piperacillin-tazobactam (ZOSYN) IVPB 3.375 g     3.375  g 12.5 mL/hr over 240 Minutes Intravenous Every 8 hours 12/05/17 2159     12/06/17 0600  vancomycin (VANCOCIN) 1,250 mg in sodium chloride 0.9 % 250 mL IVPB     1,250 mg 166.7 mL/hr over 90 Minutes Intravenous Every 12 hours 12/06/17 0400     12/05/17 2200  vancomycin (VANCOCIN) IVPB 1000 mg/200 mL premix     1,000 mg 200 mL/hr over 60 Minutes Intravenous  Once 12/05/17 2159 12/06/17 0000   12/05/17 2200  piperacillin-tazobactam (ZOSYN) IVPB 3.375 g     3.375 g 100 mL/hr over 30 Minutes Intravenous  Once 12/05/17 2159 12/05/17 2245     '    Objective: Vitals:   12/05/17 2300 12/06/17 0115 12/06/17 0328 12/06/17 0536  BP: 135/86 (!) 162/99  129/88  Pulse: (!) 113 (!) 115  (!) 116  Resp: 18 18    Temp:  100 F (37.8 C)  99.2 F (37.3 C)  TempSrc:  Oral  Oral  SpO2: 100% 96%  99%  Weight:   98.9 kg (218 lb)   Height:   5\' 8"  (1.727 m)     Intake/Output Summary (Last 24 hours) at 12/06/2017 0834 Last data filed at 12/06/2017 0600 Gross per 24 hour  Intake 1106.67 ml  Output -  Net 1106.67 ml   Filed Weights   12/06/17 0328  Weight: 98.9 kg (218 lb)    Examination:  General exam: Appears calm and comfortable  Respiratory system: Clear to auscultation. No wheezes,crackle or rhonchi Cardiovascular system: S1 & S2 heard, RRR. No JVD, murmurs, rubs or gallops Gastrointestinal system: Abdomen is nondistended, soft  and nontender.  Central nervous system: Alert and oriented. No focal neurological deficits. Extremities: No pedal edema. Symmetric, strength 5/5   Skin: Left buttocks medial aspect with erythema, incision with clots no purulent material noted Psychiatry: Mood & affect appropriate.    Data Reviewed: I have personally reviewed following labs and imaging studies  CBC: Recent Labs  Lab 12/05/17 1855 12/06/17 0603  WBC 21.4* 23.8*  NEUTROABS 15.0*  --   HGB 14.3 12.9*  HCT 41.2 37.9*  MCV 84.6 84.6  PLT 452* 443*   Basic Metabolic Panel: Recent Labs   Lab 12/05/17 1855 12/06/17 0603  NA 137  --   K 3.6  --   CL 98*  --   CO2 26  --   GLUCOSE 121*  --   BUN 9  --   CREATININE 0.96 0.81  CALCIUM 10.3  --    GFR: Estimated Creatinine Clearance: 150.6 mL/min (by C-G formula based on SCr of 0.81 mg/dL). Liver Function Tests: No results for input(s): AST, ALT, ALKPHOS, BILITOT, PROT, ALBUMIN in the last 168 hours. No results for input(s): LIPASE, AMYLASE in the last 168 hours. No results for input(s): AMMONIA in the last 168 hours. Coagulation Profile: No results for input(s): INR, PROTIME in the last 168 hours. Cardiac Enzymes: No results for input(s): CKTOTAL, CKMB, CKMBINDEX, TROPONINI in the last 168 hours. BNP (last 3 results) No results for input(s): PROBNP in the last 8760 hours. HbA1C: No results for input(s): HGBA1C in the last 72 hours. CBG: No results for input(s): GLUCAP in the last 168 hours. Lipid Profile: No results for input(s): CHOL, HDL, LDLCALC, TRIG, CHOLHDL, LDLDIRECT in the last 72 hours. Thyroid Function Tests: No results for input(s): TSH, T4TOTAL, FREET4, T3FREE, THYROIDAB in the last 72 hours. Anemia Panel: No results for input(s): VITAMINB12, FOLATE, FERRITIN, TIBC, IRON, RETICCTPCT in the last 72 hours. Sepsis Labs: Recent Labs  Lab 12/05/17 2035  LATICACIDVEN 1.82    Recent Results (from the past 240 hour(s))  Aerobic/Anaerobic Culture (surgical/deep wound)     Status: None (Preliminary result)   Collection Time: 12/05/17  9:04 PM  Result Value Ref Range Status   Specimen Description   Final    ABSCESS BUTTOCKS LEFT Performed at [facility], [address] [place], [place] [address]    Special Requests   Final    NONE Performed at [facility], [address] [place], [place] [address]    Gram Stain   Final    RARE WBC PRESENT, PREDOMINANTLY PMN NO ORGANISMS SEEN Performed at [facility], [address] [place], [place] [address]     Culture PENDING  Incomplete   Report Status PENDING  Incomplete      Radiology Studies: Ct Pelvis W Contrast  Result Date: 12/05/2017 CLINICAL DATA:  Left buttock abscess. EXAM: CT PELVIS WITH CONTRAST TECHNIQUE: Multidetector CT imaging of the pelvis was performed using the standard protocol following the bolus administration of intravenous contrast. CONTRAST:  100mL ISOVUE-300 IOPAMIDOL (ISOVUE-300) INJECTION 61% COMPARISON:  None. FINDINGS: Urinary Tract: No abnormality visualized. Physiologically distended urinary bladder without focal mural thickening. Bowel: Unremarkable visualized pelvic bowel loops. Normal visualized appendix. Minimal colonic diverticulosis along the sigmoid colon without diverticulitis. Vascular/Lymphatic: No pathologically enlarged lymph nodes along the pelvic sidewall nor inguinal regions. No significant vascular abnormality seen. Reproductive: Normal size prostate and seminal vesicles. Small central zone calcifications are noted of the prostate. Other:  No hernia identified. Musculoskeletal: Inflammatory change involving  the medial and posterior subcutaneous soft tissues of the left buttock with overlying inflammatory skin thickening. No drainable fluid collection. IMPRESSION: Cellulitis of the left buttock along the medial and posterior aspect with dermal thickening. No drainable fluid collections or adenopathy. No fistulous connection identified to the adjacent anus or rectum. Electronically Signed   By: Tollie Eth M.D.   On: 12/05/2017 22:59      Scheduled Meds: . iopamidol       Continuous Infusions: . 0.9 % NaCl with KCl 20 mEq / L 100 mL/hr at 12/06/17 0202  . piperacillin-tazobactam (ZOSYN)  IV 3.375 g (12/06/17 0520)  . vancomycin 1,250 mg (12/06/17 0520)     LOS: 1 day    Time spent: Total of 25 minutes spent with pt, greater than 50% of which was spent in discussion of  treatment, counseling and coordination of care   Latrelle Dodrill, MD Pager: Text  Page via [email or web address]   If 7PM-7AM, please contact night-coverage [email or web address] 12/06/2017, 8:34 AM   Note - This record has been created using [employer]. Chart creation errors have been sought, but may not always have been located. Such creation errors do not reflect on the standard of medical care.

## 2017-12-07 LAB — CBC WITH DIFFERENTIAL/PLATELET
BASOS PCT: 0 %
Band Neutrophils: 1 %
Basophils Absolute: 0 10*3/uL (ref 0.0–0.1)
EOS PCT: 6 %
Eosinophils Absolute: 1.1 10*3/uL — ABNORMAL HIGH (ref 0.0–0.7)
HEMATOCRIT: 37.7 % — AB (ref 39.0–52.0)
Hemoglobin: 13 g/dL (ref 13.0–17.0)
LYMPHS ABS: 2.9 10*3/uL (ref 0.7–4.0)
Lymphocytes Relative: 16 %
MCH: 29.8 pg (ref 26.0–34.0)
MCHC: 34.5 g/dL (ref 30.0–36.0)
MCV: 86.5 fL (ref 78.0–100.0)
Monocytes Absolute: 3.1 10*3/uL — ABNORMAL HIGH (ref 0.1–1.0)
Monocytes Relative: 17 %
NEUTROS ABS: 11 10*3/uL — AB (ref 1.7–7.7)
Neutrophils Relative %: 60 %
Platelets: 452 10*3/uL — ABNORMAL HIGH (ref 150–400)
RBC: 4.36 MIL/uL (ref 4.22–5.81)
RDW: 13.3 % (ref 11.5–15.5)
WBC: 18.1 10*3/uL — AB (ref 4.0–10.5)

## 2017-12-07 LAB — CREATININE, SERUM
CREATININE: 0.81 mg/dL (ref 0.61–1.24)
GFR calc Af Amer: >60 mL/min (ref >60)
GFR calc non Af Amer: >60 mL/min (ref >60)

## 2017-12-07 LAB — HIV ANTIBODY (ROUTINE TESTING W REFLEX): HIV SCREEN 4TH GENERATION: NONREACTIVE

## 2017-12-07 MED ORDER — DOXYCYCLINE HYCLATE 50 MG PO CAPS: 100.0000 mg | ORAL_CAPSULE | Freq: Two times a day (BID) | ORAL | 0 refills | Status: AC

## 2017-12-07 MED ORDER — AMOXICILLIN-POT CLAVULANATE 875-125 MG PO TABS: 1.0000 | ORAL_TABLET | Freq: Two times a day (BID) | ORAL | 0 refills | Status: AC

## 2017-12-07 NOTE — Progress Notes (Signed)
MD at bedside for dressing change. Dressing packed with Iodoform gauze. Pt. tolerated procedure well.

## 2017-12-07 NOTE — Progress Notes (Signed)
Pt. discharged to home, left via ambulation with significant other at side. No respiratory distress noted.

## 2017-12-07 NOTE — Discharge Summary (Signed)
Physician Discharge Summary  Robert Marquez  NGE:[id number]  DOB: 1985/11/17  DOA: 12/05/2017 PCP: Patient, No Pcp Per  Admit date: 12/05/2017 Discharge date: 12/07/2017  Admitted From: Home  Disposition: Home   Recommendations for Outpatient Follow-up:  1. Follow up with PCP in 1 to evaluate wound  2. Please obtain CBC in one week to monitor WBC  3. Please follow up on the following pending results: Final blood and wound cultures  Discharge Condition: Stable CODE STATUS: Full code Diet recommendation:  Regular  Brief/Interim Summary: For full details see H&P/Progress note, but in brief, Robert Marquez is a a 32 year old male with no significant past medical history who presented to the emergency department complaining of swelling and pain of his left buttock for about a week prior to admission.  Patient was seen at urgent care on 4/15 where he was found to have left buttock abscess which was lanced but only blood was drained.  He was sent home on pain medications and Bactrim, however swelling and pain worsened despite antibiotic therapy.  Upon ED evaluation patient was found to be febrile with tachycardia and WBC of 21.  Left buttock abscess was I&D in the ED and large amount of purulent fluid was drained.  Patient was admitted with working diagnosis of left buttock abscess/cellulitis after failing outpatient therapy.  Subjective: Patient seen and examined, has no complaints today.  Remains afebrile.  Denies pain.  No acute events overnight.  Discharge Diagnoses/Hospital Course:  Sepsis due to left buttock abscess/cellulitis Status post I&D 4/18, sepsis physiology resolved CT pelvis with cellulitis of left buttock along the medial and posterior aspect.  No drainable collection or fistulous. Patient was treated with vancomycin and Zosyn, responded well to antibiotic therapy transitioned to doxycycline and Augmentin will complete therapy for 7 more days. Follow-up blood and wound cultures -  so far no growth Dressing changes daily, keep packing in place until seen by PCP in 3-5 days.  Discharge Instructions  You were cared for by a hospitalist during your hospital stay. If you have any questions about your discharge medications or the care you received while you were in the hospital after you are discharged, you can call the unit and asked to speak with the hospitalist on call if the hospitalist that took care of you is not available. Once you are discharged, your primary care physician will handle any further medical issues. Please note that NO REFILLS for any discharge medications will be authorized once you are discharged, as it is imperative that you return to your primary care physician (or establish a relationship with a primary care physician if you do not have one) for your aftercare needs so that they can reassess your need for medications and monitor your lab values.  Discharge Instructions    Call MD for:  difficulty breathing, headache or visual disturbances   Complete by:  As directed    Call MD for:  extreme fatigue   Complete by:  As directed    Call MD for:  hives   Complete by:  As directed    Call MD for:  persistant dizziness or light-headedness   Complete by:  As directed    Call MD for:  persistant nausea and vomiting   Complete by:  As directed    Call MD for:  redness, tenderness, or signs of infection (pain, swelling, redness, odor or green/yellow discharge around incision site)   Complete by:  As directed    Call MD for:  severe uncontrolled pain   Complete by:  As directed    Call MD for:  temperature >100.4   Complete by:  As directed    Diet - low sodium heart healthy   Complete by:  As directed    Increase activity slowly   Complete by:  As directed      Allergies as of 12/07/2017   No Known Allergies     Medication List    STOP taking these medications   sulfamethoxazole-trimethoprim 800-160 MG tablet Commonly known as:  BACTRIM  DS,SEPTRA DS   traMADol 50 MG tablet Commonly known as:  ULTRAM     TAKE these medications   amoxicillin-clavulanate 875-125 MG tablet Commonly known as:  AUGMENTIN Take 1 tablet by mouth every 12 (twelve) hours for 7 days.   doxycycline 50 MG capsule Commonly known as:  VIBRAMYCIN Take 2 capsules (100 mg total) by mouth 2 (two) times daily for 7 days.   ibuprofen 200 MG tablet Commonly known as:  ADVIL,MOTRIN Take 800 mg by mouth every 6 (six) hours as needed for mild pain.       No Known Allergies  Consultations:  None    Procedures/Studies: Ct Pelvis W Contrast  Result Date: 12/05/2017 CLINICAL DATA:  Left buttock abscess. EXAM: CT PELVIS WITH CONTRAST TECHNIQUE: Multidetector CT imaging of the pelvis was performed using the standard protocol following the bolus administration of intravenous contrast. CONTRAST:  ISOVUE-300 IOPAMIDOL (ISOVUE-300) INJECTION 61% COMPARISON:  None. FINDINGS: Urinary Tract: No abnormality visualized. Physiologically distended urinary bladder without focal mural thickening. Bowel: Unremarkable visualized pelvic bowel loops. Normal visualized appendix. Minimal colonic diverticulosis along the sigmoid colon without diverticulitis. Vascular/Lymphatic: No pathologically enlarged lymph nodes along the pelvic sidewall nor inguinal regions. No significant vascular abnormality seen. Reproductive: Normal size prostate and seminal vesicles. Small central zone calcifications are noted of the prostate. Other:  No hernia identified. Musculoskeletal: Inflammatory change involving the medial and posterior subcutaneous soft tissues of the left buttock with overlying inflammatory skin thickening. No drainable fluid collection. IMPRESSION: Cellulitis of the left buttock along the medial and posterior aspect with dermal thickening. No drainable fluid collections or adenopathy. No fistulous connection identified to the adjacent anus or rectum. Electronically Signed    By: Tollie Eth M.D.   On: 12/05/2017 22:59    Discharge Exam: Vitals:   12/06/17 2117 12/07/17 0615  BP: 126/80 (!) 145/87  Pulse: 93 94  Resp: 16 16  Temp: 98.4 F (36.9 C) 98.6 F (37 C)  SpO2: 97% 97%   Vitals:   12/06/17 0536 12/06/17 1337 12/06/17 2117 12/07/17 0615  BP: 129/88 (!) 141/86 126/80 (!) 145/87  Pulse: (!) 116 95 93 94  Resp:  16 16 16   Temp: 99.2 F (37.3 C) 98.9 F (37.2 C) 98.4 F (36.9 C) 98.6 F (37 C)  TempSrc: Oral Oral Oral Oral  SpO2: 99% 94% 97% 97%  Weight:      Height:        General: Pt is alert, awake, not in acute distress Cardiovascular: RRR, S1/S2 +, no rubs, no gallops Respiratory: CTA bilaterally, no wheezing, no rhonchi Skin: Left Buttock, medial aspect erythema has improve, incision packed   The results of significant diagnostics from this hospitalization (including imaging, microbiology, ancillary and laboratory) are listed below for reference.     Microbiology: Recent Results (from the past 240 hour(s))  Aerobic/Anaerobic Culture (surgical/deep wound)     Status: None (Preliminary result)   Collection Time: 12/05/17  9:04 PM  Result Value Ref Range Status   Specimen Description   Final    ABSCESS BUTTOCKS LEFT Performed at [facility], [address], [place], [place] [address]    Special Requests   Final    NONE Performed at [facility], [address], [place], [place] [address]    Gram Stain   Final    RARE WBC PRESENT, PREDOMINANTLY PMN NO ORGANISMS SEEN Performed at [facility], [address], [place], [place] [address]    Culture PENDING  Incomplete   Report Status PENDING  Incomplete  Culture, blood (routine x 2)     Status: None (Preliminary result)   Collection Time: 12/05/17  9:45 PM  Result Value Ref Range Status   Specimen Description   Final    BLOOD RIGHT ANTECUBITAL Performed at [facility], [address], [place], [place]  [address]    Special Requests   Final    BOTTLES DRAWN AEROBIC AND ANAEROBIC Blood Culture results may not be optimal due to an excessive volume of blood received in culture bottles Performed at [facility], [address], [place], [place] [address]    Culture   Final    NO GROWTH < 24 HOURS Performed at [facility], [address], [place], [place] [address]    Report Status PENDING  Incomplete     Labs: BNP (last 3 results) No results for input(s): BNP in the last 8760 hours. Basic Metabolic Panel: Recent Labs  Lab 12/05/17 1855 12/06/17 0603 12/07/17 0602  NA 137  --   --   K 3.6  --   --   CL 98*  --   --   CO2 26  --   --   GLUCOSE 121*  --   --   BUN 9  --   --   CREATININE 0.96 0.81 0.81  CALCIUM 10.3  --   --    Liver Function Tests: No results for input(s): AST, ALT, ALKPHOS, BILITOT, PROT, ALBUMIN in the last 168 hours. No results for input(s): LIPASE, AMYLASE in the last 168 hours. No results for input(s): AMMONIA in the last 168 hours. CBC: Recent Labs  Lab 12/05/17 1855 12/06/17 0603 12/07/17 0602  WBC 21.4* 23.8* 18.1*  NEUTROABS 15.0*  --  11.0*  HGB 14.3 12.9* 13.0  HCT 41.2 37.9* 37.7*  MCV 84.6 84.6 86.5  PLT 452* 443* 452*   Cardiac Enzymes: No results for input(s): CKTOTAL, CKMB, CKMBINDEX, TROPONINI in the last 168 hours. BNP: Invalid input(s): POCBNP CBG: No results for input(s): GLUCAP in the last 168 hours. D-Dimer No results for input(s): DDIMER in the last 72 hours. Hgb A1c No results for input(s): HGBA1C in the last 72 hours. Lipid Profile No results for input(s): CHOL, HDL, LDLCALC, TRIG, CHOLHDL, LDLDIRECT in the last 72 hours. Thyroid function studies No results for input(s): TSH, T4TOTAL, T3FREE, THYROIDAB in the last 72 hours.  Invalid input(s): FREET3 Anemia work up No results for input(s): VITAMINB12, FOLATE, FERRITIN, TIBC, IRON, RETICCTPCT in the last 72 hours. Urinalysis No results found  for: COLORURINE, APPEARANCEUR, LABSPEC, PHURINE, GLUCOSEU, HGBUR, BILIRUBINUR, KETONESUR, PROTEINUR, UROBILINOGEN, NITRITE, LEUKOCYTESUR Sepsis Labs Invalid input(s): PROCALCITONIN,  WBC,  LACTICIDVEN Microbiology Recent Results (from the past 240 hour(s))  Aerobic/Anaerobic Culture (surgical/deep wound)     Status: None (Preliminary result)   Collection Time: 12/05/17  9:04 PM  Result Value Ref Range Status  Specimen Description   Final    ABSCESS BUTTOCKS LEFT Performed at [facility], [address] [place], [place] [address]    Special Requests   Final    NONE Performed at [facility], [address] [place], [place] [address]    Gram Stain   Final    RARE WBC PRESENT, PREDOMINANTLY PMN NO ORGANISMS SEEN Performed at [facility], [address] [place], [place] [address]    Culture PENDING  Incomplete   Report Status PENDING  Incomplete  Culture, blood (routine x 2)     Status: None (Preliminary result)   Collection Time: 12/05/17  9:45 PM  Result Value Ref Range Status   Specimen Description   Final    BLOOD RIGHT ANTECUBITAL Performed at [facility], [address] [place], [place] [address]    Special Requests   Final    BOTTLES DRAWN AEROBIC AND ANAEROBIC Blood Culture results may not be optimal due to an excessive volume of blood received in culture bottles Performed at [facility], [address] [place], [place] [address]    Culture   Final    NO GROWTH < 24 HOURS Performed at [facility], [address] [place], [place] [address]    Report Status PENDING  Incomplete    Time coordinating discharge: 32 minutes  SIGNED:  Latrelle DodrillEdwin Silva, MD  [facility] 12/07/2017, 9:54 AM  Pager please text page via  [email or web address]  Note - This record has been created using [employer] software. Chart creation errors have been sought, but may not always have been located. Such  creation errors do not reflect on the standard of medical care.

## 2017-12-07 NOTE — Progress Notes (Signed)
Discharge teaching completed with teach back. Discharge instructions given and reviewed with pt. Prescriptions given for Amoxicillin and Doxycycline. Pt. understands to call for follow up appointment and return to clinic tomorrow for a dressing change.

## 2017-12-07 NOTE — Care Management (Signed)
Information listed for clinics on AVS.  Patient should call to make an appointment at one of 3 clinics listed to go to open hours at [facility] on Thursday mornings and wait to be seen.  Pt can use pharmacy at [facility] when seen at any of these 3 clinics.

## 2017-12-10 LAB — AEROBIC/ANAEROBIC CULTURE (SURGICAL/DEEP WOUND): CULTURE: NORMAL

## 2017-12-10 LAB — CULTURE, BLOOD (ROUTINE X 2): Culture: NO GROWTH

## 2017-12-10 LAB — AEROBIC/ANAEROBIC CULTURE W GRAM STAIN (SURGICAL/DEEP WOUND)

## 2017-12-11 LAB — CULTURE, BLOOD (ROUTINE X 2)
Culture: NO GROWTH
SPECIAL REQUESTS: ADEQUATE

## 2018-01-18 ENCOUNTER — Emergency Department (HOSPITAL_COMMUNITY): Payer: No Typology Code available for payment source

## 2018-01-18 ENCOUNTER — Emergency Department (HOSPITAL_COMMUNITY)
Admission: EM | Admit: 2018-01-18 | Discharge: 2018-01-18 | Disposition: A | Discharge status: Home / Self Care | Payer: No Typology Code available for payment source | Attending: Emergency Medicine | Admitting: Emergency Medicine

## 2018-01-18 ENCOUNTER — Other Ambulatory Visit: Payer: Self-pay

## 2018-01-18 ENCOUNTER — Encounter (HOSPITAL_COMMUNITY): Payer: Self-pay

## 2018-01-18 DIAGNOSIS — F17200 Nicotine dependence, unspecified, uncomplicated: Secondary | ICD-10-CM | POA: Diagnosis not present

## 2018-01-18 DIAGNOSIS — S199XXA Unspecified injury of neck, initial encounter: Secondary | ICD-10-CM | POA: Diagnosis present

## 2018-01-18 DIAGNOSIS — M62830 Muscle spasm of back: Secondary | ICD-10-CM | POA: Diagnosis not present

## 2018-01-18 DIAGNOSIS — Y9389 Activity, other specified: Secondary | ICD-10-CM | POA: Insufficient documentation

## 2018-01-18 DIAGNOSIS — M62838 Other muscle spasm: Secondary | ICD-10-CM

## 2018-01-18 DIAGNOSIS — Y999 Unspecified external cause status: Secondary | ICD-10-CM | POA: Insufficient documentation

## 2018-01-18 DIAGNOSIS — M549 Dorsalgia, unspecified: Secondary | ICD-10-CM | POA: Insufficient documentation

## 2018-01-18 DIAGNOSIS — Y92410 Unspecified street and highway as the place of occurrence of the external cause: Secondary | ICD-10-CM | POA: Diagnosis not present

## 2018-01-18 DIAGNOSIS — S134XXA Sprain of ligaments of cervical spine, initial encounter: Secondary | ICD-10-CM | POA: Diagnosis not present

## 2018-01-18 DIAGNOSIS — M542 Cervicalgia: Secondary | ICD-10-CM

## 2018-01-18 MED ORDER — CYCLOBENZAPRINE HCL 10 MG PO TABS: 10.0000 mg | ORAL_TABLET | Freq: Three times a day (TID) | ORAL | 0 refills | Status: DC | PRN

## 2018-01-18 MED ORDER — NAPROXEN 500 MG PO TABS: 500.0000 mg | ORAL_TABLET | Freq: Two times a day (BID) | ORAL | 0 refills | Status: DC | PRN

## 2018-01-18 NOTE — Discharge Instructions (Signed)
Take naprosyn as directed for inflammation and pain with tylenol for breakthrough pain and flexeril for muscle relaxation. Do not drive or operate machinery with muscle relaxant use. Ice to areas of soreness for the next 24 hours and then may move to heat, no more than 20 minutes at a time every hour for each. Expect to be sore for the next few days and follow up with primary care physician for recheck of ongoing symptoms in the next 1-2 weeks. Return to ER for emergent changing or worsening of symptoms.  °  °
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
## 2018-01-22 ENCOUNTER — Encounter (HOSPITAL_COMMUNITY): Payer: Self-pay | Admitting: Emergency Medicine

## 2018-01-22 ENCOUNTER — Ambulatory Visit (HOSPITAL_COMMUNITY)
Admission: EM | Admit: 2018-01-22 | Discharge: 2018-01-22 | Disposition: A | Discharge status: Home / Self Care | Payer: Self-pay | Attending: Internal Medicine | Admitting: Internal Medicine

## 2018-01-22 DIAGNOSIS — S161XXA Strain of muscle, fascia and tendon at neck level, initial encounter: Secondary | ICD-10-CM

## 2018-01-22 DIAGNOSIS — M546 Pain in thoracic spine: Secondary | ICD-10-CM

## 2018-01-22 MED ORDER — DICLOFENAC SODIUM 75 MG PO TBEC: 75.0000 mg | DELAYED_RELEASE_TABLET | Freq: Two times a day (BID) | ORAL | 0 refills | Status: DC

## 2018-01-22 MED ORDER — METHOCARBAMOL 750 MG PO TABS: 750.0000 mg | ORAL_TABLET | Freq: Three times a day (TID) | ORAL | 0 refills | Status: AC
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
## 2018-01-22 NOTE — Discharge Instructions (Signed)
Please apply heat to neck  Continue to take anti-inflammatories- tylenol, ibuprofen, naproxen or diclofenac Continue muscle relaxer- flexeril or robaxin  Expect gradual resolution over the next 1-2 weeks.  Return if pain worsening, not improving, developing numbness, tingling or weakness
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
## 2018-02-03 ENCOUNTER — Encounter (HOSPITAL_COMMUNITY): Payer: Self-pay | Admitting: Emergency Medicine

## 2018-02-03 ENCOUNTER — Ambulatory Visit (HOSPITAL_COMMUNITY)
Admission: EM | Admit: 2018-02-03 | Discharge: 2018-02-03 | Disposition: A | Discharge status: Home / Self Care | Payer: Self-pay | Attending: Urgent Care | Admitting: Urgent Care

## 2018-02-03 DIAGNOSIS — M542 Cervicalgia: Secondary | ICD-10-CM

## 2018-02-03 DIAGNOSIS — R030 Elevated blood-pressure reading, without diagnosis of hypertension: Secondary | ICD-10-CM

## 2018-02-03 MED ORDER — MELOXICAM 15 MG PO TABS: 7.5000 mg | ORAL_TABLET | Freq: Every day | ORAL | 0 refills | Status: DC

## 2018-02-03 MED ORDER — CYCLOBENZAPRINE HCL 10 MG PO TABS: 10.0000 mg | ORAL_TABLET | Freq: Every day | ORAL | 0 refills | Status: DC

## 2018-02-03 NOTE — ED Triage Notes (Signed)
Pt here for note to return to work without restrictions

## 2018-02-03 NOTE — Discharge Instructions (Signed)
Hydrate well with at least 2 liters (1 gallon) of water daily.  °

## 2018-02-03 NOTE — ED Notes (Signed)
Bed: UC01 Expected date:  Expected time:  Means of arrival:  Comments: For appts 
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
## 2020-07-01 ENCOUNTER — Ambulatory Visit (HOSPITAL_COMMUNITY): Payer: Self-pay

## 2020-07-01 ENCOUNTER — Encounter (HOSPITAL_COMMUNITY): Payer: Self-pay | Admitting: Emergency Medicine

## 2020-07-01 ENCOUNTER — Ambulatory Visit: Payer: Self-pay

## 2020-07-01 ENCOUNTER — Other Ambulatory Visit: Payer: Self-pay

## 2020-07-01 ENCOUNTER — Ambulatory Visit (HOSPITAL_COMMUNITY)
Admission: EM | Admit: 2020-07-01 | Discharge: 2020-07-01 | Disposition: A | Discharge status: Home / Self Care | Payer: Self-pay

## 2020-07-01 DIAGNOSIS — M545 Low back pain, unspecified: Secondary | ICD-10-CM

## 2020-07-01 MED ORDER — CYCLOBENZAPRINE HCL 5 MG PO TABS: 5.0000 mg | ORAL_TABLET | Freq: Two times a day (BID) | ORAL | 0 refills | Status: DC | PRN

## 2020-07-01 MED ORDER — IBUPROFEN 800 MG PO TABS: 800.0000 mg | ORAL_TABLET | Freq: Three times a day (TID) | ORAL | 0 refills | Status: DC

## 2020-07-01 NOTE — Discharge Instructions (Signed)
Use anti-inflammatories for pain/swelling. You may take up to 800 mg Ibuprofen every 8 hours with food. You may supplement Ibuprofen with Tylenol 500-1000 mg every 8 hours.   You may use flexeril as needed to help with pain. This is a muscle relaxer and causes sedation- please use only at bedtime or when you will be home and not have to drive/work  Gentle stretching Alternate ice and heat  Follow up if not improving or worsening 

## 2020-07-01 NOTE — ED Provider Notes (Signed)
[facility]    CSN: [id number] Arrival date & time: 07/01/20  1242      History   Chief Complaint Chief Complaint  Patient presents with  . Back Pain    HPI Robert Marquez is a 34 y.o. male presenting today for evaluation of back pain.  Reports that on Wednesday was doing heavy lifting of a couch, also works at [employer] and does a lot of lifting.  Over the past 2 to 3 days he has had increased pain in his right lower back.  Took ibuprofen and symptoms feel slightly improved today.  Was sent home from work early yesterday.  He denies any radiation into extremities.  Denies any change in urination/bowel movements.  Denies saddle anesthesia.  Worse with turning and twisting.  HPI  History reviewed. No pertinent past medical history.  Patient Active Problem List   Diagnosis Date Noted  . Abscess of buttock, left 12/05/2017    History reviewed. No pertinent surgical history.     Home Medications    Prior to Admission medications   Medication Sig Start Date End Date Taking? Authorizing Provider  cyclobenzaprine (FLEXERIL) 5 MG tablet Take 1-2 tablets (5-10 mg total) by mouth 2 (two) times daily as needed for muscle spasms. 07/01/20   Wilian Kwong C, PA-C  ibuprofen (ADVIL) 800 MG tablet Take 1 tablet (800 mg total) by mouth 3 (three) times daily. 07/01/20   Rohn Fritsch, Junius Creamer, PA-C    Family History Family History  Problem Relation Age of Onset  . Healthy Mother     Social History Social History   Tobacco Use  . Smoking status: Current Every Day Smoker  . Smokeless tobacco: Never Used  Substance Use Topics  . Alcohol use: Yes  . Drug use: No     Allergies   Patient has no known allergies.   Review of Systems Review of Systems  Constitutional: Negative for fatigue and fever.  Eyes: Negative for redness, itching and visual disturbance.  Respiratory: Negative for shortness of breath.   Cardiovascular: Negative for chest pain and leg swelling.   Gastrointestinal: Negative for nausea and vomiting.  Genitourinary: Negative for decreased urine volume and difficulty urinating.  Musculoskeletal: Positive for back pain and myalgias. Negative for arthralgias.  Skin: Negative for color change, rash and wound.  Neurological: Negative for dizziness, syncope, weakness, light-headedness and headaches.     Physical Exam Triage Vital Signs ED Triage Vitals  Enc Vitals Group     BP 07/01/20 1310 133/74     Pulse Rate 07/01/20 1310 (!) 105     Resp 07/01/20 1310 16     Temp 07/01/20 1310 98.2 F (36.8 C)     Temp Source 07/01/20 1310 Oral     SpO2 07/01/20 1310 99 %     Weight --      Height --      Head Circumference --      Peak Flow --      Pain Score 07/01/20 1307 7     Pain Loc --      Pain Edu? --      Excl. in GC? --    No data found.  Updated Vital Signs BP 133/74 (BP Location: Right Arm)   Pulse (!) 105   Temp 98.2 F (36.8 C) (Oral)   Resp 16   SpO2 99%   Visual Acuity Right Eye Distance:   Left Eye Distance:   Bilateral Distance:    Right Eye  Near:   Left Eye Near:    Bilateral Near:     Physical Exam Vitals and nursing note reviewed.  Constitutional:      Appearance: He is well-developed.     Comments: No acute distress  HENT:     Head: Normocephalic and atraumatic.     Nose: Nose normal.  Eyes:     Conjunctiva/sclera: Conjunctivae normal.  Cardiovascular:     Rate and Rhythm: Normal rate.  Pulmonary:     Effort: Pulmonary effort is normal. No respiratory distress.  Abdominal:     General: There is no distension.  Musculoskeletal:        General: Normal range of motion.     Cervical back: Neck supple.     Comments: Nontender to palpation of cervical and thoracic spine midline, superior lumbar area with mild tenderness to palpation midline, no palpable deformity or step-off, no overlying discoloration, right lumbar paraspinal musculature and superior portion of lumbar area with tenderness to  palpation, notable swelling and tightness over musculature  Strength at hips knees 5/5 ankle bilaterally, patellar reflex 2+ bilaterally  Skin:    General: Skin is warm and dry.  Neurological:     Mental Status: He is alert and oriented to person, place, and time.      UC Treatments / Results  Labs (all labs ordered are listed, but only abnormal results are displayed) Labs Reviewed - No data to display  EKG   Radiology No results found.  Procedures Procedures (including critical care time)  Medications Ordered in UC Medications - No data to display  Initial Impression / Assessment and Plan / UC Course  I have reviewed the triage vital signs and the nursing notes.  Pertinent labs & imaging results that were available during my care of the patient were reviewed by me and considered in my medical decision making (see chart for details).     Lumbar strain-no mechanism of injury, low suspicion of acute bony abnormality.  No neuro deficits, no red flags for cauda equina.  Recommending continued use of NSAIDs and muscle relaxers, activity modification.  Recommend provided x1 week for no heavy lifting. Discussed strict return precautions. Patient verbalized understanding and is agreeable with plan.   Final Clinical Impressions(s) / UC Diagnoses   Final diagnoses:  Acute right-sided low back pain without sciatica     Discharge Instructions     Use anti-inflammatories for pain/swelling. You may take up to 800 mg Ibuprofen every 8 hours with food. You may supplement Ibuprofen with Tylenol [id number] mg every 8 hours.   You may use flexeril as needed to help with pain. This is a muscle relaxer and causes sedation- please use only at bedtime or when you will be home and not have to drive/work  Gentle stretching  Alternate ice and heat Follow up if not improving or worsening   ED Prescriptions    Medication Sig Dispense Auth. Provider   ibuprofen (ADVIL) 800 MG tablet Take  1 tablet (800 mg total) by mouth 3 (three) times daily. 21 tablet Elton Heid C, PA-C   cyclobenzaprine (FLEXERIL) 5 MG tablet Take 1-2 tablets (5-10 mg total) by mouth 2 (two) times daily as needed for muscle spasms. 24 tablet Icyss Skog, Vidette C, PA-C     PDMP not reviewed this encounter.   Lew Dawes, [place] 07/01/20 1349

## 2020-07-01 NOTE — ED Triage Notes (Addendum)
Patient c/o RT sided lower back pain. since Wednesday (this week).   Patient took Ibuprofen w/ some relief of symptoms.    Patient endorses pain w/ " sharp turning".  Patient denies fall or recent trauma.  Patient states " I think I have a strained muscle, work wants me to get it checked out"

## 2020-07-06 ENCOUNTER — Ambulatory Visit: Payer: Self-pay

## 2020-07-06 ENCOUNTER — Encounter (HOSPITAL_COMMUNITY): Payer: Self-pay | Admitting: Emergency Medicine

## 2020-07-06 ENCOUNTER — Other Ambulatory Visit: Payer: Self-pay

## 2020-07-06 ENCOUNTER — Ambulatory Visit (HOSPITAL_COMMUNITY)
Admission: EM | Admit: 2020-07-06 | Discharge: 2020-07-06 | Disposition: A | Discharge status: Home / Self Care | Payer: Self-pay

## 2020-07-06 DIAGNOSIS — Z7689 Persons encountering health services in other specified circumstances: Secondary | ICD-10-CM

## 2020-07-06 NOTE — ED Triage Notes (Signed)
Pt presents for follow-up with back pain. States he needs a note to be released back to work. States back pain has improved since last visit.

## 2020-07-07 ENCOUNTER — Ambulatory Visit (HOSPITAL_COMMUNITY): Payer: Self-pay

## 2020-07-07 NOTE — ED Provider Notes (Signed)
[facility]    CSN: [id number] Arrival date & time: 07/06/20  1225      History   Chief Complaint Chief Complaint  Patient presents with   Back Pain    HPI Robert Marquez is a 34 y.o. male.   Patient is a 34 year old male presents today with follow-up for back pain.  Reports that his back pain has improved.  He is needing a note to return to work full duty.  No concerning signs or symptoms today.     History reviewed. No pertinent past medical history.  Patient Active Problem List   Diagnosis Date Noted   Abscess of buttock, left 12/05/2017    History reviewed. No pertinent surgical history.     Home Medications    Prior to Admission medications   Medication Sig Start Date End Date Taking? Authorizing Provider  cyclobenzaprine (FLEXERIL) 5 MG tablet Take 1-2 tablets (5-10 mg total) by mouth 2 (two) times daily as needed for muscle spasms. 07/01/20   Wieters, Hallie C, PA-C  ibuprofen (ADVIL) 800 MG tablet Take 1 tablet (800 mg total) by mouth 3 (three) times daily. 07/01/20   Wieters, Junius Creamer, PA-C    Family History Family History  Problem Relation Age of Onset   Healthy Mother     Social History Social History   Tobacco Use   Smoking status: Current Every Day Smoker   Smokeless tobacco: Never Used  Substance Use Topics   Alcohol use: Yes   Drug use: No     Allergies   Patient has no known allergies.   Review of Systems Review of Systems   Physical Exam Triage Vital Signs ED Triage Vitals  Enc Vitals Group     BP 07/06/20 1330 (!) 160/111     Pulse Rate 07/06/20 1330 (!) 114     Resp 07/06/20 1330 17     Temp 07/06/20 1330 99.6 F (37.6 C)     Temp Source 07/06/20 1330 Oral     SpO2 07/06/20 1330 98 %     Weight --      Height --      Head Circumference --      Peak Flow --      Pain Score 07/06/20 1329 0     Pain Loc --      Pain Edu? --      Excl. in GC? --    No data found.  Updated Vital Signs BP (!)  160/111 (BP Location: Right Arm)    Pulse (!) 114    Temp 99.6 F (37.6 C) (Oral)    Resp 17    SpO2 98%   Visual Acuity Right Eye Distance:   Left Eye Distance:   Bilateral Distance:    Right Eye Near:   Left Eye Near:    Bilateral Near:     Physical Exam Vitals and nursing note reviewed.  Constitutional:      Appearance: Normal appearance.  HENT:     Head: Normocephalic and atraumatic.     Nose: Nose normal.  Eyes:     Conjunctiva/sclera: Conjunctivae normal.  Pulmonary:     Effort: Pulmonary effort is normal.  Musculoskeletal:        General: Normal range of motion.     Cervical back: Normal range of motion.  Skin:    General: Skin is warm and dry.  Neurological:     Mental Status: He is alert.  Psychiatric:  Mood and Affect: Mood normal.      UC Treatments / Results  Labs (all labs ordered are listed, but only abnormal results are displayed) Labs Reviewed - No data to display  EKG   Radiology No results found.  Procedures Procedures (including critical care time)  Medications Ordered in UC Medications - No data to display  Initial Impression / Assessment and Plan / UC Course  I have reviewed the triage vital signs and the nursing notes.  Pertinent labs & imaging results that were available during my care of the patient were reviewed by me and considered in my medical decision making (see chart for details).     Return to work note Patient feeling better and his back pain has improved.  He would like to go back to work full duty after his light duty has ended Work note provided Final Clinical Impressions(s) / UC Diagnoses   Final diagnoses:  Return to work evaluation   Discharge Instructions   None    ED Prescriptions    None     PDMP not reviewed this encounter.   Janace Aris, NP 07/07/20 [id number]

## 2020-07-20 ENCOUNTER — Ambulatory Visit (HOSPITAL_COMMUNITY)
Admission: EM | Admit: 2020-07-20 | Discharge: 2020-07-20 | Disposition: A | Discharge status: Home / Self Care | Payer: Self-pay | Attending: Emergency Medicine | Admitting: Emergency Medicine

## 2020-07-20 ENCOUNTER — Encounter (HOSPITAL_COMMUNITY): Payer: Self-pay

## 2020-07-20 ENCOUNTER — Other Ambulatory Visit: Payer: Self-pay

## 2020-07-20 DIAGNOSIS — M545 Low back pain, unspecified: Secondary | ICD-10-CM

## 2020-07-20 DIAGNOSIS — L739 Follicular disorder, unspecified: Secondary | ICD-10-CM

## 2020-07-20 MED ORDER — DOXYCYCLINE HYCLATE 100 MG PO CAPS: 100.0000 mg | ORAL_CAPSULE | Freq: Two times a day (BID) | ORAL | 0 refills | Status: AC

## 2020-07-20 NOTE — Discharge Instructions (Signed)
Take doxycycline twice daily for the next 7 days. Continue to use anti-inflammatory and muscle relaxer as directed.

## 2020-07-20 NOTE — ED Triage Notes (Signed)
Pt presents with intermittent lower back pain X 2 weeks; pt also complains of abscess on left underarm X 1 week.

## 2020-07-20 NOTE — ED Provider Notes (Signed)
____________________________________________  Time seen: Approximately 8:38 PM  I have reviewed the triage vital signs and the nursing notes.   HISTORY  Chief Complaint Back Pain and Abscess   Historian Patient    HPI Robert Marquez is a 34 y.o. male presents to the urgent care with bilateral low back pain and concern for a possible left axillary abscess.  Patient states that  he was seen and evaluated on 07/01/2020 and was prescribed a muscle relaxer.  Patient states that muscle relaxer and an anti-inflammatory have been helping his back pain but he would like a couple more days off from his job.  He denies bowel or bladder incontinence or saddle anesthesia.  No weakness of the lower extremities.  Patient is secondarily concerned about a region of folliculitis along the left axilla.  Patient denies fever and chills.  He has not noticed any erythema on the affected area.   History reviewed. No pertinent past medical history.   Immunizations up to date:  Yes.     History reviewed. No pertinent past medical history.  Patient Active Problem List   Diagnosis Date Noted  . Abscess of buttock, left 12/05/2017    History reviewed. No pertinent surgical history.  Prior to Admission medications   Medication Sig Start Date End Date Taking? Authorizing Provider  cyclobenzaprine (FLEXERIL) 5 MG tablet Take 1-2 tablets (5-10 mg total) by mouth 2 (two) times daily as needed for muscle spasms. 07/01/20   Wieters, Hallie C, PA-C  doxycycline (VIBRAMYCIN) 100 MG capsule Take 1 capsule (100 mg total) by mouth 2 (two) times daily for 7 days. 07/20/20 07/27/20  Orvil Feil, PA-C  ibuprofen (ADVIL) 800 MG tablet Take 1 tablet (800 mg total) by mouth 3 (three) times daily. 07/01/20   Wieters, Hallie C, PA-C    Allergies Patient has no known allergies.  Family History  Problem Relation Age of Onset  . Healthy Mother     Social History Social History   Tobacco Use  . Smoking  status: Current Every Day Smoker  . Smokeless tobacco: Never Used  Substance Use Topics  . Alcohol use: Yes  . Drug use: No     Review of Systems  Constitutional: No fever/chills Eyes:  No discharge ENT: No upper respiratory complaints. Respiratory: no cough. No SOB/ use of accessory muscles to breath Gastrointestinal:   No nausea, no vomiting.  No diarrhea.  No constipation. Musculoskeletal: Patient has back pain.  Skin: Patient has region of folliculitis at left axilla.    ____________________________________________   PHYSICAL EXAM:  VITAL SIGNS: ED Triage Vitals  Enc Vitals Group     BP 07/20/20 1918 (!) 133/92     Pulse Rate 07/20/20 1918 87     Resp 07/20/20 1918 18     Temp 07/20/20 1918 98.6 F (37 C)     Temp Source 07/20/20 1918 Oral     SpO2 07/20/20 1918 100 %     Weight --      Height --      Head Circumference --      Peak Flow --      Pain Score 07/20/20 1917 5     Pain Loc --      Pain Edu? --      Excl. in GC? --      Constitutional: Alert and oriented. Well appearing and in no acute distress. Eyes: Conjunctivae are normal. PERRL. EOMI. Head: Atraumatic.  Cardiovascular: Normal rate, regular rhythm. Normal S1 and  S2.  Good peripheral circulation. Respiratory: Normal respiratory effort without tachypnea or retractions. Lungs CTAB. Good air entry to the bases with no decreased or absent breath sounds Gastrointestinal: Bowel sounds x 4 quadrants. Soft and nontender to palpation. No guarding or rigidity. No distention. Musculoskeletal: Full range of motion to all extremities. No obvious deformities noted.  Patient has some paraspinal muscle tenderness along the lumbar spine. Neurologic:  Normal for age. No gross focal neurologic deficits are appreciated.  Skin: Patient has a 1 cm x 1 cm region of palpable induration at left axilla with no overlying erythema.  No appreciable fluctuance to palpation. Psychiatric: Mood and affect are normal for age.  Speech and behavior are normal.   ____________________________________________   LABS (all labs ordered are listed, but only abnormal results are displayed)  Labs Reviewed - No data to display ____________________________________________  EKG   ____________________________________________  RADIOLOGY   No results found.  ____________________________________________    PROCEDURES  Procedure(s) performed:     Procedures     Medications - No data to display   ____________________________________________   INITIAL IMPRESSION / ASSESSMENT AND PLAN / ED COURSE  Pertinent labs & imaging results that were available during my care of the patient were reviewed by me and considered in my medical decision making (see chart for details).      Assessment and plan Low back pain Folliculitis 34 year old male presents to the urgent care with 2 medical complaints.  He states that his back pain is well managed at home but he would like a work note for several more days.  A work note was provided at his request.  Inform patient that I do not think region of left axilla folliculitis is conducive to incision and drainage at this time.  We will treat with doxycycline twice daily for the next 7 days.  Patient was given return precautions to return to the urgent care with worsening erythema or pain.  He voiced understanding.  All patient questions were answered.   ____________________________________________  FINAL CLINICAL IMPRESSION(S) / ED DIAGNOSES  Final diagnoses:  Acute bilateral low back pain without sciatica  Folliculitis      NEW MEDICATIONS STARTED DURING THIS VISIT:  ED Discharge Orders         Ordered    doxycycline (VIBRAMYCIN) 100 MG capsule  2 times daily        07/20/20 2000              This chart was dictated using voice recognition software/Dragon. Despite best efforts to proofread, errors can occur which can change the meaning. Any change was  purely unintentional.     Orvil Feil, PA-C 07/20/20 2042

## 2020-10-13 ENCOUNTER — Encounter (HOSPITAL_COMMUNITY): Payer: Self-pay

## 2020-10-26 ENCOUNTER — Encounter (HOSPITAL_COMMUNITY): Payer: Self-pay

## 2020-10-26 ENCOUNTER — Other Ambulatory Visit: Payer: Self-pay

## 2020-10-26 ENCOUNTER — Ambulatory Visit (HOSPITAL_COMMUNITY)
Admission: RE | Admit: 2020-10-26 | Discharge: 2020-10-26 | Disposition: A | Discharge status: Home / Self Care | Payer: Self-pay | Source: Ambulatory Visit | Attending: Urgent Care | Admitting: Urgent Care

## 2020-10-26 VITALS — BP 126/81 | HR 93 | Temp 98.8°F | Resp 18

## 2020-10-26 DIAGNOSIS — M79672 Pain in left foot: Secondary | ICD-10-CM

## 2020-10-26 DIAGNOSIS — L089 Local infection of the skin and subcutaneous tissue, unspecified: Secondary | ICD-10-CM

## 2020-10-26 DIAGNOSIS — S90822A Blister (nonthermal), left foot, initial encounter: Secondary | ICD-10-CM

## 2020-10-26 MED ORDER — CEPHALEXIN 500 MG PO CAPS: 500.0000 mg | ORAL_CAPSULE | Freq: Three times a day (TID) | ORAL | 0 refills | Status: DC

## 2020-10-26 MED ORDER — NAPROXEN 500 MG PO TABS: 500.0000 mg | ORAL_TABLET | Freq: Two times a day (BID) | ORAL | 0 refills | Status: DC

## 2020-10-26 NOTE — ED Provider Notes (Signed)
  [facility]   MRN: [id number] DOB: 19-Apr-1986  Subjective:   Robert Marquez is a 35 y.o. male presenting for 3-day history of 2 blisters over the left foot.  Patient has 1 in the middle of the plantar surface of his foot and just lateral to the first MTP.  States that he wear shoes for work that do not breathe swelling his feet stay moist.  Suspect that this is where they came from.  Initially when he noticed him, he popped both of them.  However, his girlfriend actually remove the skin from the bottom one.  He has since had worsening pain, redness and swelling.  Denies taking chronic medications.    No Known Allergies  History reviewed. No pertinent past medical history.   History reviewed. No pertinent surgical history.  Family History  Problem Relation Age of Onset  . Healthy Mother     Social History   Tobacco Use  . Smoking status: Current Every Day Smoker  . Smokeless tobacco: Never Used  Substance Use Topics  . Alcohol use: Yes  . Drug use: No    ROS   Objective:   Vitals: BP 126/81 (BP Location: Left Arm)   Pulse 93   Temp 98.8 F (37.1 C) (Oral)   Resp 18   SpO2 95%   Physical Exam Constitutional:      General: He is not in acute distress.    Appearance: Normal appearance. He is well-developed and normal weight. He is not ill-appearing, toxic-appearing or diaphoretic.  HENT:     Head: Normocephalic and atraumatic.     Right Ear: External ear normal.     Left Ear: External ear normal.     Nose: Nose normal.     Mouth/Throat:     Pharynx: Oropharynx is clear.  Eyes:     General: No scleral icterus.       Right eye: No discharge.        Left eye: No discharge.     Extraocular Movements: Extraocular movements intact.     Pupils: Pupils are equal, round, and reactive to light.  Cardiovascular:     Rate and Rhythm: Normal rate.  Pulmonary:     Effort: Pulmonary effort is normal.  Musculoskeletal:     Cervical back: Normal range  of motion.       Feet:  Neurological:     Mental Status: He is alert and oriented to person, place, and time.     Motor: No weakness.     Coordination: Coordination normal.     Gait: Gait normal.     Deep Tendon Reflexes: Reflexes normal.  Psychiatric:        Mood and Affect: Mood normal.        Behavior: Behavior normal.        Thought Content: Thought content normal.        Judgment: Judgment normal.      Assessment and Plan :   PDMP not reviewed this encounter.  1. Left foot pain   2. Infected blister of left foot, initial encounter     Will use Keflex for infectious process. Start naproxen for pain control. Counseled on general wound care.  Counseled patient on potential for adverse effects with medications prescribed/recommended today, ER and return-to-clinic precautions discussed, patient verbalized understanding.    Wallis Bamberg, PA-C 10/26/20 1302

## 2020-10-26 NOTE — ED Triage Notes (Signed)
Pt reports has 2 water blisters in the left foot x 3 days.

## 2020-11-11 ENCOUNTER — Ambulatory Visit (HOSPITAL_COMMUNITY)
Admission: RE | Admit: 2020-11-11 | Discharge: 2020-11-11 | Disposition: A | Discharge status: Home / Self Care | Payer: Self-pay | Source: Ambulatory Visit | Attending: Emergency Medicine | Admitting: Emergency Medicine

## 2020-11-11 ENCOUNTER — Encounter (HOSPITAL_COMMUNITY): Payer: Self-pay

## 2020-11-11 ENCOUNTER — Other Ambulatory Visit: Payer: Self-pay

## 2020-11-11 VITALS — BP 135/93 | HR 106 | Temp 99.6°F | Resp 18

## 2020-11-11 DIAGNOSIS — L0291 Cutaneous abscess, unspecified: Secondary | ICD-10-CM

## 2020-11-11 MED ORDER — DOXYCYCLINE HYCLATE 100 MG PO CAPS: 100.0000 mg | ORAL_CAPSULE | Freq: Two times a day (BID) | ORAL | 0 refills | Status: DC

## 2020-11-11 NOTE — Discharge Instructions (Addendum)
Take antibiotic twice a day for 7 days  Warm compresses at least four times a day to area  Follow up for non healing wound, increased pain, swelling, fever, chills

## 2020-11-11 NOTE — ED Triage Notes (Signed)
Pt presents with abscess in buttocks area xs 2-3 days.

## 2020-11-11 NOTE — ED Provider Notes (Signed)
[facility]    CSN: [id number] Arrival date & time: 11/11/20  1359      History   Chief Complaint Chief Complaint  Patient presents with  . Abscess    APPT    HPI Robert Marquez is a 35 y.o. male.   Patient presents with abscess in fold of buttocks, unsure when it began. Became painful last night, uncomfortable so sit directly on butt. Drainage began this morning, blood and pus mixed. Felt feverish this morning as well.   History reviewed. No pertinent past medical history.  Patient Active Problem List   Diagnosis Date Noted  . Abscess of buttock, left 12/05/2017    History reviewed. No pertinent surgical history.     Home Medications    Prior to Admission medications   Medication Sig Start Date End Date Taking? Authorizing Provider  doxycycline (VIBRAMYCIN) 100 MG capsule Take 1 capsule (100 mg total) by mouth 2 (two) times daily. 11/11/20  Yes White, Adrienne R, NP  cephALEXin (KEFLEX) 500 MG capsule Take 1 capsule (500 mg total) by mouth 3 (three) times daily. 10/26/20   Wallis Bamberg, PA-C  naproxen (NAPROSYN) 500 MG tablet Take 1 tablet (500 mg total) by mouth 2 (two) times daily with a meal. 10/26/20   Wallis Bamberg, PA-C    Family History Family History  Problem Relation Age of Onset  . Healthy Mother     Social History Social History   Tobacco Use  . Smoking status: Current Every Day Smoker  . Smokeless tobacco: Never Used  Substance Use Topics  . Alcohol use: Yes  . Drug use: No     Allergies   Patient has no known allergies.   Review of Systems Review of Systems  Constitutional: Positive for fever. Negative for activity change, appetite change, chills, diaphoresis, fatigue and unexpected weight change.  Genitourinary: Negative.   Skin: Positive for wound. Negative for color change, pallor and rash.  Neurological: Negative.      Physical Exam Triage Vital Signs ED Triage Vitals  Enc Vitals Group     BP 11/11/20 1431 (!) 135/93      Pulse Rate 11/11/20 1431 (!) 106     Resp 11/11/20 1431 18     Temp 11/11/20 1431 99.6 F (37.6 C)     Temp Source 11/11/20 1431 Oral     SpO2 11/11/20 1431 96 %     Weight --      Height --      Head Circumference --      Peak Flow --      Pain Score 11/11/20 1430 3     Pain Loc --      Pain Edu? --      Excl. in GC? --    No data found.  Updated Vital Signs BP (!) 135/93 (BP Location: Left Arm)   Pulse (!) 106   Temp 99.6 F (37.6 C) (Oral)   Resp 18   SpO2 96%   Visual Acuity Right Eye Distance:   Left Eye Distance:   Bilateral Distance:    Right Eye Near:   Left Eye Near:    Bilateral Near:     Physical Exam Constitutional:      Appearance: Normal appearance. He is normal weight.  HENT:     Head: Normocephalic.  Eyes:     Extraocular Movements: Extraocular movements intact.  Pulmonary:     Effort: Pulmonary effort is normal.  Musculoskeletal:  General: Normal range of motion.     Cervical back: Normal range of motion.  Skin:    General: Skin is warm and dry.          Comments: Abscess present in gluteal fold already draining, copious purulent fluid expelled with pressure applied   Neurological:     Mental Status: He is alert and oriented to person, place, and time. Mental status is at baseline.  Psychiatric:        Mood and Affect: Mood normal.        Behavior: Behavior normal.        Thought Content: Thought content normal.        Judgment: Judgment normal.      UC Treatments / Results  Labs (all labs ordered are listed, but only abnormal results are displayed) Labs Reviewed - No data to display  EKG   Radiology No results found.  Procedures Procedures (including critical care time)  Medications Ordered in UC Medications - No data to display  Initial Impression / Assessment and Plan / UC Course  I have reviewed the triage vital signs and the nursing notes.  Pertinent labs & imaging results that were available during my  care of the patient were reviewed by me and considered in my medical decision making (see chart for details).  Abscess of gluteal fold  1. Doxycyline 100 mg bid for 7 days 2. Warm compresses at least four times a day   3. Follow up for non healing wound, increased pain, swelling, fever, chills   Final Clinical Impressions(s) / UC Diagnoses   Final diagnoses:  Abscess     Discharge Instructions     Take antibiotic twice a day for 7 days  Warm compresses at least four times a day to area  Follow up for non healing wound, increased pain, swelling, fever, chills    ED Prescriptions    Medication Sig Dispense Auth. Provider   doxycycline (VIBRAMYCIN) 100 MG capsule Take 1 capsule (100 mg total) by mouth 2 (two) times daily. 14 capsule White, Elita Tsuda, NP     PDMP not reviewed this encounter.   Valinda Hoar, NP 11/11/20 1501

## 2021-02-28 ENCOUNTER — Ambulatory Visit: Payer: Self-pay

## 2021-02-28 ENCOUNTER — Ambulatory Visit (HOSPITAL_COMMUNITY): Payer: Self-pay

## 2021-03-01 ENCOUNTER — Ambulatory Visit (HOSPITAL_COMMUNITY): Payer: Self-pay

## 2022-07-16 ENCOUNTER — Ambulatory Visit (HOSPITAL_COMMUNITY): Payer: Self-pay

## 2022-07-26 ENCOUNTER — Ambulatory Visit (INDEPENDENT_AMBULATORY_CARE_PROVIDER_SITE_OTHER): Payer: Self-pay

## 2022-07-26 ENCOUNTER — Ambulatory Visit
Admission: EM | Admit: 2022-07-26 | Discharge: 2022-07-26 | Disposition: A | Discharge status: Home / Self Care | Payer: Self-pay | Attending: Emergency Medicine | Admitting: Emergency Medicine

## 2022-07-26 ENCOUNTER — Ambulatory Visit: Payer: Self-pay

## 2022-07-26 DIAGNOSIS — R059 Cough, unspecified: Secondary | ICD-10-CM | POA: Insufficient documentation

## 2022-07-26 DIAGNOSIS — J101 Influenza due to other identified influenza virus with other respiratory manifestations: Secondary | ICD-10-CM | POA: Insufficient documentation

## 2022-07-26 DIAGNOSIS — J22 Unspecified acute lower respiratory infection: Secondary | ICD-10-CM | POA: Insufficient documentation

## 2022-07-26 DIAGNOSIS — Z79899 Other long term (current) drug therapy: Secondary | ICD-10-CM | POA: Insufficient documentation

## 2022-07-26 DIAGNOSIS — Z1152 Encounter for screening for COVID-19: Secondary | ICD-10-CM | POA: Insufficient documentation

## 2022-07-26 LAB — POCT INFLUENZA A/B
Influenza A, POC: NEGATIVE
Influenza B, POC: NEGATIVE

## 2022-07-26 MED ORDER — AMOXICILLIN-POT CLAVULANATE 875-125 MG PO TABS: 1.0000 | ORAL_TABLET | Freq: Two times a day (BID) | ORAL | 0 refills | Status: AC

## 2022-07-26 MED ORDER — IPRATROPIUM BROMIDE 0.06 % NA SOLN: 2.0000 | Freq: Three times a day (TID) | NASAL | 1 refills | Status: DC

## 2022-07-26 MED ORDER — IBUPROFEN 400 MG PO TABS: 400.0000 mg | ORAL_TABLET | Freq: Three times a day (TID) | ORAL | 0 refills | Status: DC | PRN

## 2022-07-26 MED ORDER — GUAIFENESIN 400 MG PO TABS: ORAL_TABLET | ORAL | 0 refills | Status: DC

## 2022-07-26 MED ORDER — PROMETHAZINE-DM 6.25-15 MG/5ML PO SYRP: 5.0000 mL | ORAL_SOLUTION | Freq: Four times a day (QID) | ORAL | 0 refills | Status: DC | PRN

## 2022-07-26 MED ORDER — ALBUTEROL SULFATE HFA 108 (90 BASE) MCG/ACT IN AERS
2.0000 | INHALATION_SPRAY | Freq: Once | RESPIRATORY_TRACT | Status: AC
  Administered 2022-07-26: 2 via RESPIRATORY_TRACT

## 2022-07-26 NOTE — ED Triage Notes (Signed)
Pt states last Saturday he began having a cough, and lack of appetite.  Home interventions: theraflu

## 2022-07-26 NOTE — ED Provider Notes (Signed)
[facility]    CSN: [id number] Arrival date & time: 07/26/22  1913    HISTORY   Chief Complaint  Patient presents with   Cough   HPI Robert Marquez is a pleasant, 36 y.o. male who presents to urgent care today. Pt states 5 days ago he began having a cough, fatigue, weakness and lack of appetite.  Patient states he is alternately felt hot and cold but is not sure whether or not he had a fever.  Patient denies known sick contacts.  Patient states the cough feels like it should be productive but is having a hard time coughing anything up.  Patient states he tried taking TheraFlu with no relief of his symptoms.  Patient denies feeling short of breath but sometimes the cough makes him feel very tired.  Patient reports a history of smoking but states he has not smoked since his symptoms began 5 days ago.  Patient denies nausea, vomiting, diarrhea, sore throat, body aches and chills.  The history is provided by the patient.   History reviewed. No pertinent past medical history. Patient Active Problem List   Diagnosis Date Noted   Abscess of buttock, left 12/05/2017   History reviewed. No pertinent surgical history.  Home Medications    Prior to Admission medications   Medication Sig Start Date End Date Taking? Authorizing Provider  cephALEXin (KEFLEX) 500 MG capsule Take 1 capsule (500 mg total) by mouth 3 (three) times daily. 10/26/20   Wallis Bamberg, PA-C  doxycycline (VIBRAMYCIN) 100 MG capsule Take 1 capsule (100 mg total) by mouth 2 (two) times daily. 11/11/20   Valinda Hoar, NP  naproxen (NAPROSYN) 500 MG tablet Take 1 tablet (500 mg total) by mouth 2 (two) times daily with a meal. 10/26/20   Wallis Bamberg, PA-C    Family History Family History  Problem Relation Age of Onset   Healthy Mother    Social History Social History   Tobacco Use   Smoking status: Every Day   Smokeless tobacco: Never  Substance Use Topics   Alcohol use: Yes   Drug use: No   Allergies    Patient has no known allergies.  Review of Systems Review of Systems Pertinent findings revealed after performing a 14 point review of systems has been noted in the history of present illness.  Physical Exam Triage Vital Signs ED Triage Vitals  Enc Vitals Group     BP 06/16/21 0827 (!) 147/82     Pulse Rate 06/16/21 0827 72     Resp 06/16/21 0827 18     Temp 06/16/21 0827 98.3 F (36.8 C)     Temp Source 06/16/21 0827 Oral     SpO2 06/16/21 0827 98 %     Weight --      Height --      Head Circumference --      Peak Flow --      Pain Score 06/16/21 0826 5     Pain Loc --      Pain Edu? --      Excl. in GC? --   No data found.  Updated Vital Signs BP (!) 162/102 (BP Location: Left Arm)   Pulse (!) 102   Temp 99.8 F (37.7 C) (Oral)   Resp 18   SpO2 96%   Physical Exam Vitals and nursing note reviewed.  Constitutional:      General: He is not in acute distress.    Appearance: Normal appearance. He is not  ill-appearing.  HENT:     Head: Normocephalic and atraumatic.     Salivary Glands: Right salivary gland is not diffusely enlarged or tender. Left salivary gland is not diffusely enlarged or tender.     Right Ear: Ear canal and external ear normal. No drainage. No middle ear effusion. There is no impacted cerumen. Tympanic membrane is bulging. Tympanic membrane is not injected or erythematous.     Left Ear: Ear canal and external ear normal. No drainage.  No middle ear effusion. There is no impacted cerumen. Tympanic membrane is bulging. Tympanic membrane is not injected or erythematous.     Ears:     Comments: Bilateral EACs normal, both TMs bulging with clear fluid    Nose: Rhinorrhea present. No nasal deformity, septal deviation, signs of injury, nasal tenderness, mucosal edema or congestion. Rhinorrhea is clear.     Right Nostril: No foreign body, epistaxis, septal hematoma or occlusion.     Left Nostril: No foreign body, epistaxis, septal hematoma or occlusion.      Right Turbinates: Swollen and pale. Not enlarged.     Left Turbinates: Swollen and pale. Not enlarged.     Right Sinus: No maxillary sinus tenderness or frontal sinus tenderness.     Left Sinus: No maxillary sinus tenderness or frontal sinus tenderness.     Mouth/Throat:     Lips: Pink. No lesions.     Mouth: Mucous membranes are moist. No oral lesions.     Pharynx: Oropharynx is clear. Uvula midline. No posterior oropharyngeal erythema or uvula swelling.     Tonsils: No tonsillar exudate. 0 on the right. 0 on the left.     Comments: Postnasal drip Eyes:     General: Lids are normal.        Right eye: No discharge.        Left eye: No discharge.     Extraocular Movements: Extraocular movements intact.     Conjunctiva/sclera: Conjunctivae normal.     Right eye: Right conjunctiva is not injected.     Left eye: Left conjunctiva is not injected.  Neck:     Trachea: Trachea and phonation normal.  Cardiovascular:     Rate and Rhythm: Normal rate and regular rhythm.     Pulses: Normal pulses.     Heart sounds: Normal heart sounds. No murmur heard.    No friction rub. No gallop.  Pulmonary:     Effort: Pulmonary effort is normal. No accessory muscle usage, prolonged expiration or respiratory distress.     Breath sounds: No stridor, decreased air movement or transmitted upper airway sounds. Examination of the left-middle field reveals decreased breath sounds. Examination of the left-lower field reveals decreased breath sounds. Decreased breath sounds present. No wheezing, rhonchi or rales.  Chest:     Chest wall: No tenderness.  Musculoskeletal:        General: Normal range of motion.     Cervical back: Normal range of motion and neck supple. Normal range of motion.  Lymphadenopathy:     Cervical: Cervical adenopathy present.     Right cervical: Superficial cervical adenopathy and posterior cervical adenopathy present.     Left cervical: Superficial cervical adenopathy and posterior  cervical adenopathy present.  Skin:    General: Skin is warm and dry.     Findings: No erythema or rash.  Neurological:     General: No focal deficit present.     Mental Status: He is alert and oriented to person, place, and  time.  Psychiatric:        Mood and Affect: Mood normal.        Behavior: Behavior normal.     Visual Acuity Right Eye Distance:   Left Eye Distance:   Bilateral Distance:    Right Eye Near:   Left Eye Near:    Bilateral Near:     UC Couse / Diagnostics / Procedures:     Radiology No results found.  Procedures Procedures (including critical care time) EKG  Pending results:  Labs Reviewed  RESP PANEL BY RT-PCR (FLU A&B, COVID) ARPGX2  POCT INFLUENZA A/B    Medications Ordered in UC: Medications  albuterol (VENTOLIN HFA) 108 (90 Base) MCG/ACT inhaler 2 puff (2 puffs Inhalation Given 07/26/22 2018)    UC Diagnoses / Final Clinical Impressions(s)   I have reviewed the triage vital signs and the nursing notes.  Pertinent labs & imaging results that were available during my care of the patient were reviewed by me and considered in my medical decision making (see chart for details).    Final diagnoses:  Acute lower respiratory infection   Patient reported good response to albuterol treatment, repeat auscultation revealed the same.  Please see discharge instructions below for further details of plan of care.  ED Prescriptions     Medication Sig Dispense Auth. Provider   amoxicillin-clavulanate (AUGMENTIN) 875-125 MG tablet Take 1 tablet by mouth 2 (two) times daily for 5 days. 10 tablet Theadora RamaMorgan, Kadesia Robel Scales, PA-C   guaifenesin (HUMIBID E) 400 MG TABS tablet Take 1 tablet 3 times daily as needed for chest congestion and cough 21 tablet Theadora RamaMorgan, Media Pizzini Scales, PA-C   promethazine-dextromethorphan (PROMETHAZINE-DM) 6.25-15 MG/5ML syrup Take 5 mLs by mouth 4 (four) times daily as needed for cough. 118 mL Theadora RamaMorgan, Bryanna Yim Scales, PA-C   ipratropium  (ATROVENT) 0.06 % nasal spray Place 2 sprays into both nostrils 3 (three) times daily. As needed for nasal congestion, runny nose 15 mL Theadora RamaMorgan, Tiffay Pinette Scales, PA-C   ibuprofen (ADVIL) 400 MG tablet Take 1 tablet (400 mg total) by mouth every 8 (eight) hours as needed for up to 30 doses. 30 tablet Theadora RamaMorgan, Haydon Kalmar Scales, PA-C      PDMP not reviewed this encounter.  Disposition Upon Discharge:  Condition: stable for discharge home Home: take medications as prescribed; routine discharge instructions as discussed; follow up as advised.  Patient presented with an acute illness with associated systemic symptoms and significant discomfort requiring urgent management. In my opinion, this is a condition that a prudent lay person (someone who possesses an average knowledge of health and medicine) may potentially expect to result in complications if not addressed urgently such as respiratory distress, impairment of bodily function or dysfunction of bodily organs.   Routine symptom specific, illness specific and/or disease specific instructions were discussed with the patient and/or caregiver at length.   As such, the patient has been evaluated and assessed, work-up was performed and treatment was provided in alignment with urgent care protocols and evidence based medicine.  Patient/parent/caregiver has been advised that the patient may require follow up for further testing and treatment if the symptoms continue in spite of treatment, as clinically indicated and appropriate.  If the patient was tested for COVID-19, Influenza and/or RSV, then the patient/parent/guardian was advised to isolate at home pending the results of his/her diagnostic coronavirus test and potentially longer if they're positive. I have also advised pt that if his/her COVID-19 test returns positive, it's recommended to self-isolate for at  least 10 days after symptoms first appeared AND until fever-free for 24 hours without fever reducer  AND other symptoms have improved or resolved. Discussed self-isolation recommendations as well as instructions for household member/close contacts as per the [employer] and [place] [facility], and also gave patient the COVID packet with this information.  Patient/parent/caregiver has been advised to return to the [facility] or PCP in 3-5 days if no better; to PCP or the Emergency Department if new signs and symptoms develop, or if the current signs or symptoms continue to change or worsen for further workup, evaluation and treatment as clinically indicated and appropriate  The patient will follow up with their current PCP if and as advised. If the patient does not currently have a PCP we will assist them in obtaining one.   The patient may need specialty follow up if the symptoms continue, in spite of conservative treatment and management, for further workup, evaluation, consultation and treatment as clinically indicated and appropriate.  Patient/parent/caregiver verbalized understanding and agreement of plan as discussed.  All questions were addressed during visit.  Please see discharge instructions below for further details of plan.  Discharge Instructions:   Discharge Instructions      Your rapid antigen flu test today was negative.  We also performed an influenza and COVID-19 PCR test, the results of that test should be back tomorrow evening around 8 PM and will be posted to your MyChart account.  If there is a positive result, you will be contacted by phone information.  Your chest x-ray report states they do not see any clear evidence of pneumonia, based on your decreased breath sounds on the left, your severe cough, slightly diminished oxygen saturation and low-grade fever, I believe that you would benefit from a short course of antibiotics to treat you for possible bacterial infection in your lower respiratory tract.  The albuterol treatment that you received today will open up your airways and improve your work  of breathing.  It will also make coughing more efficient and help you cough up excess phlegm which will help you better sooner.  I also believe that you would benefit from taking a medication that would thin secretions and using a topical nasal spray which will reduce mucus production.  I provided you with a nighttime cough syrup as well called Promethazine DM which should make you fairly sleepy and help you get some rest.    Please follow-up in the next 3 to 5 days if you are not feeling significantly better.      This office note has been dictated using Teaching laboratory technician.  Unfortunately, this method of dictation can sometimes lead to typographical or grammatical errors.  I apologize for your inconvenience in advance if this occurs.  Please do not hesitate to reach out to me if clarification is needed.      Theadora Rama Scales, [place] 07/29/22 [phone]

## 2022-07-26 NOTE — Discharge Instructions (Addendum)
Your rapid antigen flu test today was negative.  We also performed an influenza and COVID-19 PCR test, the results of that test should be back tomorrow evening around 8 PM and will be posted to your MyChart account.  If there is a positive result, you will be contacted by phone information.  Your chest x-ray report states they do not see any clear evidence of pneumonia, based on your decreased breath sounds on the left, your severe cough, slightly diminished oxygen saturation and low-grade fever, I believe that you would benefit from a short course of antibiotics to treat you for possible bacterial infection in your lower respiratory tract.  The albuterol treatment that you received today will open up your airways and improve your work of breathing.  It will also make coughing more efficient and help you cough up excess phlegm which will help you better sooner.  I also believe that you would benefit from taking a medication that would thin secretions and using a topical nasal spray which will reduce mucus production.  I provided you with a nighttime cough syrup as well called Promethazine DM which should make you fairly sleepy and help you get some rest.    Please follow-up in the next 3 to 5 days if you are not feeling significantly better.

## 2022-07-27 LAB — RESP PANEL BY RT-PCR (FLU A&B, COVID) ARPGX2
Influenza A by PCR: POSITIVE — AB
Influenza B by PCR: NEGATIVE
SARS Coronavirus 2 by RT PCR: NEGATIVE

## 2023-07-21 ENCOUNTER — Ambulatory Visit (HOSPITAL_COMMUNITY): Payer: Self-pay

## 2024-08-04 MED ORDER — MUPIROCIN 2 % EX OINT: 1.0000 | TOPICAL_OINTMENT | Freq: Two times a day (BID) | CUTANEOUS | 0 refills | Status: DC

## 2024-08-24 ENCOUNTER — Ambulatory Visit (HOSPITAL_COMMUNITY): Payer: Self-pay

## 2024-08-25 ENCOUNTER — Ambulatory Visit (HOSPITAL_COMMUNITY)
Admission: RE | Admit: 2024-08-25 | Discharge: 2024-08-25 | Disposition: A | Discharge status: Home / Self Care | Payer: Self-pay | Source: Ambulatory Visit | Attending: Physician Assistant | Admitting: Physician Assistant

## 2024-08-25 ENCOUNTER — Encounter (HOSPITAL_COMMUNITY): Payer: Self-pay

## 2024-08-25 VITALS — BP 144/98 | HR 83 | Temp 98.6°F | Resp 17

## 2024-08-25 DIAGNOSIS — L02412 Cutaneous abscess of left axilla: Secondary | ICD-10-CM

## 2024-08-25 MED ORDER — LIDOCAINE HCL (PF) 2 % IJ SOLN
INTRAMUSCULAR | Status: AC
  Filled 2024-08-25: qty 5

## 2024-08-25 MED ORDER — DOXYCYCLINE HYCLATE 100 MG PO CAPS: 100.0000 mg | ORAL_CAPSULE | Freq: Two times a day (BID) | ORAL | 0 refills | Status: AC

## 2024-08-25 NOTE — ED Provider Notes (Signed)
 " [facility]    CSN: [id number] Arrival date & time: 08/25/24  1904      History   Chief Complaint Chief Complaint  Patient presents with   Abscess    HPI Robert Marquez is a 39 y.o. male.   Patient complains of a swollen abscess in his left axilla.  Patient states that he has had it for over a week and it has not drained.  Patient is hoping that the area can be drained.  Patient denies any fever or chills he has not had any nausea or vomiting.  The history is provided by the patient. No language interpreter was used.  Abscess   History reviewed. No pertinent past medical history.  Patient Active Problem List   Diagnosis Date Noted   Abscess of buttock, left 12/05/2017    History reviewed. No pertinent surgical history.     Home Medications    Prior to Admission medications  Medication Sig Start Date End Date Taking? Authorizing Provider  doxycycline  (VIBRAMYCIN ) 100 MG capsule Take 1 capsule (100 mg total) by mouth 2 (two) times daily. 08/25/24  Yes Flint Sonny POUR, PA-C    Family History Family History  Problem Relation Age of Onset   Healthy Mother     Social History Social History[1]   Allergies   Patient has no known allergies.   Review of Systems Review of Systems  All other systems reviewed and are negative.    Physical Exam Triage Vital Signs ED Triage Vitals  Encounter Vitals Group     BP 08/25/24 1939 (!) 144/98     Girls Systolic BP Percentile --      Girls Diastolic BP Percentile --      Boys Systolic BP Percentile --      Boys Diastolic BP Percentile --      Pulse Rate 08/25/24 1939 83     Resp 08/25/24 1939 17     Temp 08/25/24 1939 98.6 F (37 C)     Temp Source 08/25/24 1939 Oral     SpO2 08/25/24 1939 94 %     Weight --      Height --      Head Circumference --      Peak Flow --      Pain Score 08/25/24 1938 5     Pain Loc --      Pain Education --      Exclude from Growth Chart --    No data  found.  Updated Vital Signs BP (!) 144/98 (BP Location: Left Arm)   Pulse 83   Temp 98.6 F (37 C) (Oral)   Resp 17   SpO2 94%   Visual Acuity Right Eye Distance:   Left Eye Distance:   Bilateral Distance:    Right Eye Near:   Left Eye Near:    Bilateral Near:     Physical Exam Vitals and nursing note reviewed.  Constitutional:      Appearance: He is well-developed.  HENT:     Head: Normocephalic.  Cardiovascular:     Rate and Rhythm: Normal rate.  Pulmonary:     Effort: Pulmonary effort is normal.  Abdominal:     General: There is no distension.  Musculoskeletal:        General: Normal range of motion.     Comments: 2cm swollen area left axilla, fluctuant  Skin:    General: Skin is warm.  Neurological:     General: No focal deficit  present.     Mental Status: He is alert and oriented to person, place, and time.      UC Treatments / Results  Labs (all labs ordered are listed, but only abnormal results are displayed) Labs Reviewed - No data to display  EKG   Radiology No results found.  Procedures Incision and Drainage  Date/Time: 08/25/2024 8:14 PM  Performed by: Flint Sonny POUR, PA-C Authorized by: Flint Sonny POUR, PA-C   Consent:    Consent obtained:  Verbal   Consent given by:  Patient   Risks discussed:  Bleeding and incomplete drainage Universal protocol:    Procedure explained and questions answered to patient or proxy's satisfaction: yes     Immediately prior to procedure, a time out was called: yes     Patient identity confirmed:  Verbally with patient Location:    Type:  Abscess   Location:  Upper extremity Pre-procedure details:    Skin preparation:  Povidone-iodine Anesthesia:    Anesthesia method:  Local infiltration   Local anesthetic:  Lidocaine  1% w/o epi Procedure type:    Complexity:  Simple Procedure details:    Incision depth:  Submucosal   Drainage amount:  Copious   Packing materials:  None Post-procedure details:     Procedure completion:  Tolerated  (including critical care time)  Medications Ordered in UC Medications - No data to display  Initial Impression / Assessment and Plan / UC Course  I have reviewed the triage vital signs and the nursing notes.  Pertinent labs & imaging results that were available during my care of the patient were reviewed by me and considered in my medical decision making (see chart for details).      Final Clinical Impressions(s) / UC Diagnoses   Final diagnoses:  Abscess of left axilla     Discharge Instructions      Return if any problems    ED Prescriptions     Medication Sig Dispense Auth. Provider   doxycycline  (VIBRAMYCIN ) 100 MG capsule Take 1 capsule (100 mg total) by mouth 2 (two) times daily. 20 capsule Sharnelle Cappelli K, PA-C      PDMP not reviewed this encounter. An After Visit Summary was printed and given to the patient.        [1]  Social History Tobacco Use   Smoking status: Never   Smokeless tobacco: Never  Vaping Use   Vaping status: Never Used  Substance Use Topics   Alcohol use: Not Currently   Drug use: Not Currently     Flint Sonny POUR, DEVONNA 08/25/24 2015  "

## 2024-08-25 NOTE — ED Triage Notes (Signed)
 Pt has c/o abscess under left armpit x 1 week. Pt wants to see if it needs to be drained.

## 2024-08-25 NOTE — Discharge Instructions (Addendum)
 Return if any problems.
